# Patient Record
Sex: Male | Born: 1980 | Hispanic: Yes | Marital: Married | Smoking: Never smoker
Health system: Southern US, Community
[De-identification: ages and names within clinical notes are randomized; demographics above are authoritative.]

## PROBLEM LIST (undated history)

## (undated) DIAGNOSIS — Z789 Other specified health status: Secondary | ICD-10-CM

## (undated) HISTORY — PX: APPENDECTOMY: SHX54

---

## 2021-06-19 ENCOUNTER — Inpatient Hospital Stay (HOSPITAL_COMMUNITY)
Admission: EM | Admit: 2021-06-19 | Discharge: 2021-06-22 | DRG: 988 | Disposition: A | Discharge status: Home / Self Care | Payer: Self-pay | Attending: Internal Medicine | Admitting: Internal Medicine

## 2021-06-19 ENCOUNTER — Encounter (HOSPITAL_COMMUNITY): Payer: Self-pay | Admitting: Emergency Medicine

## 2021-06-19 ENCOUNTER — Other Ambulatory Visit: Payer: Self-pay

## 2021-06-19 ENCOUNTER — Emergency Department (HOSPITAL_COMMUNITY): Payer: Self-pay

## 2021-06-19 DIAGNOSIS — L0291 Cutaneous abscess, unspecified: Secondary | ICD-10-CM

## 2021-06-19 DIAGNOSIS — M009 Pyogenic arthritis, unspecified: Secondary | ICD-10-CM | POA: Diagnosis present

## 2021-06-19 DIAGNOSIS — B9562 Methicillin resistant Staphylococcus aureus infection as the cause of diseases classified elsewhere: Secondary | ICD-10-CM | POA: Diagnosis present

## 2021-06-19 DIAGNOSIS — L03116 Cellulitis of left lower limb: Principal | ICD-10-CM | POA: Diagnosis present

## 2021-06-19 DIAGNOSIS — S80812A Abrasion, left lower leg, initial encounter: Secondary | ICD-10-CM

## 2021-06-19 DIAGNOSIS — Z20822 Contact with and (suspected) exposure to covid-19: Secondary | ICD-10-CM | POA: Diagnosis present

## 2021-06-19 DIAGNOSIS — L089 Local infection of the skin and subcutaneous tissue, unspecified: Secondary | ICD-10-CM | POA: Insufficient documentation

## 2021-06-19 HISTORY — DX: Other specified health status: Z78.9

## 2021-06-19 LAB — CBC WITH DIFFERENTIAL/PLATELET
Abs Immature Granulocytes: 0.04 10*3/uL (ref 0.00–0.07)
Basophils Absolute: 0 10*3/uL (ref 0.0–0.1)
Basophils Relative: 0 %
Eosinophils Absolute: 0.1 10*3/uL (ref 0.0–0.5)
Eosinophils Relative: 1 %
HCT: 43.2 % (ref 39.0–52.0)
Hemoglobin: 14.4 g/dL (ref 13.0–17.0)
Immature Granulocytes: 0 %
Lymphocytes Relative: 10 %
Lymphs Abs: 1 10*3/uL (ref 0.7–4.0)
MCH: 31.5 pg (ref 26.0–34.0)
MCHC: 33.3 g/dL (ref 30.0–36.0)
MCV: 94.5 fL (ref 80.0–100.0)
Monocytes Absolute: 1.1 10*3/uL — ABNORMAL HIGH (ref 0.1–1.0)
Monocytes Relative: 11 %
Neutro Abs: 7.9 10*3/uL — ABNORMAL HIGH (ref 1.7–7.7)
Neutrophils Relative %: 78 %
Platelets: 268 10*3/uL (ref 150–400)
RBC: 4.57 MIL/uL (ref 4.22–5.81)
RDW: 12.6 % (ref 11.5–15.5)
WBC: 10.2 10*3/uL (ref 4.0–10.5)
nRBC: 0 % (ref 0.0–0.2)

## 2021-06-19 LAB — BASIC METABOLIC PANEL
Anion gap: 6 (ref 5–15)
BUN: 13 mg/dL (ref 6–20)
CO2: 28 mmol/L (ref 22–32)
Calcium: 8.7 mg/dL — ABNORMAL LOW (ref 8.9–10.3)
Chloride: 104 mmol/L (ref 98–111)
Creatinine, Ser: 1.01 mg/dL (ref 0.61–1.24)
GFR, Estimated: >60 mL/min (ref >60)
Glucose, Bld: 69 mg/dL — ABNORMAL LOW (ref 70–99)
Potassium: 4.3 mmol/L (ref 3.5–5.1)
Sodium: 138 mmol/L (ref 135–145)

## 2021-06-19 LAB — C-REACTIVE PROTEIN: CRP: 8.1 mg/dL — ABNORMAL HIGH (ref <1.0)

## 2021-06-19 LAB — SEDIMENTATION RATE: Sed Rate: 40 mm/hr — ABNORMAL HIGH (ref 0–16)

## 2021-06-19 LAB — RESP PANEL BY RT-PCR (FLU A&B, COVID) ARPGX2
Influenza A by PCR: NEGATIVE
Influenza B by PCR: NEGATIVE
SARS Coronavirus 2 by RT PCR: NEGATIVE

## 2021-06-19 MED ORDER — ACETAMINOPHEN 325 MG PO TABS
650.0000 mg | ORAL_TABLET | Freq: Four times a day (QID) | ORAL | Status: DC | PRN
  Administered 2021-06-19 – 2021-06-20 (×2): 650 mg via ORAL
  Filled 2021-06-19 (×2): qty 2

## 2021-06-19 MED ORDER — CEFAZOLIN SODIUM-DEXTROSE 1-4 GM/50ML-% IV SOLN
1.0000 g | Freq: Three times a day (TID) | INTRAVENOUS | Status: DC
  Administered 2021-06-19 – 2021-06-20 (×2): 1 g via INTRAVENOUS
  Filled 2021-06-19 (×2): qty 50

## 2021-06-19 MED ORDER — ACETAMINOPHEN 650 MG RE SUPP: 650.0000 mg | Freq: Four times a day (QID) | RECTAL | Status: DC | PRN

## 2021-06-19 MED ORDER — CEFAZOLIN SODIUM-DEXTROSE 1-4 GM/50ML-% IV SOLN
1.0000 g | Freq: Once | INTRAVENOUS | Status: AC
  Administered 2021-06-19: 14:00:00 1 g via INTRAVENOUS
  Filled 2021-06-19: qty 50

## 2021-06-19 MED ORDER — TRAMADOL HCL 50 MG PO TABS
50.0000 mg | ORAL_TABLET | Freq: Four times a day (QID) | ORAL | Status: DC | PRN
  Administered 2021-06-20 – 2021-06-22 (×5): 50 mg via ORAL
  Filled 2021-06-19 (×5): qty 1

## 2021-06-19 NOTE — H&P (Signed)
History and Physical    Gregory Leon VQQ:[id number] DOB: October 04, 1980 DOA: 06/19/2021  PCP: System, Provider Not In  Patient coming from: Home  Chief Complaint: left knee pain  HPI: Gregory Leon is a 40 y.o. male with no significant medical history. Presenting w/ left knee pain. He reports that he had a pimple on the lateral aspect of his knee about 2 days ago. He popped it. Ever since then, he has had increased redness and edema. It worsened through this morning and made it very difficult to walk d/t pain. When his symptoms did not improve, he decided to come to the ED for help. He denies any other aggravating or alleviating factors.   ED Course: XR w/ normal joint space. Ortho was consulted. They rec'd IV abx; no concern for septic joint. EDP aspirated fluid from a collection superior to the left knee joint space. TRH was called for admission.   Review of Systems:  Denies CP, palpitations, dyspnea, abdominal pain, N/V/D, fever. Review of systems is otherwise negative for all not mentioned in HPI.   PMHx Past Medical History:  Diagnosis Date   Medical history non-contributory     PSHx Past Surgical History:  Procedure Laterality Date   APPENDECTOMY      SocHx  reports that he has never smoked. He has never used smokeless tobacco. He reports current alcohol use. He reports that he does not currently use drugs.  No Known Allergies  FamHx History reviewed. No pertinent family history.  Prior to Admission medications   Not on File    Physical Exam: Vitals:   06/19/21 1430 06/19/21 1445 06/19/21 1500 06/19/21 1545  BP: 121/73 121/75 120/76 115/74  Pulse: 92 90 90 85  Resp:  16 17 18   Temp:      SpO2: 98% 99% 99% 98%    General: 40 y.o. male resting in bed in NAD Eyes: PERRL, normal sclera ENMT: Nares patent w/o discharge, orophaynx clear, dentition normal, ears w/o discharge/lesions/ulcers Neck: Supple, trachea midline Cardiovascular: RRR, +S1, S2,  no m/g/r, equal pulses throughout Respiratory: CTABL, no w/r/r, normal WOB GI: BS+, NDNT, no masses noted, no organomegaly noted MSK: No c/c; left lateral knee erythema/edema Skin: No rashes, bruises, ulcerations noted Neuro: A&O x 3, no focal deficits Psyc: Appropriate interaction and affect, calm/cooperative  Labs on Admission: I have personally reviewed following labs and imaging studies  CBC: Recent Labs  Lab 06/19/21 1232  WBC 10.2  NEUTROABS 7.9*  HGB 14.4  HCT 43.2  MCV 94.5  PLT 268   Basic Metabolic Panel: Recent Labs  Lab 06/19/21 1232  NA 138  K 4.3  CL 104  CO2 28  GLUCOSE 69*  BUN 13  CREATININE 1.01  CALCIUM 8.7*   GFR: CrCl cannot be calculated (Unknown ideal weight.). Liver Function Tests: No results for input(s): AST, ALT, ALKPHOS, BILITOT, PROT, ALBUMIN in the last 168 hours. No results for input(s): LIPASE, AMYLASE in the last 168 hours. No results for input(s): AMMONIA in the last 168 hours. Coagulation Profile: No results for input(s): INR, PROTIME in the last 168 hours. Cardiac Enzymes: No results for input(s): CKTOTAL, CKMB, CKMBINDEX, TROPONINI in the last 168 hours. BNP (last 3 results) No results for input(s): PROBNP in the last 8760 hours. HbA1C: No results for input(s): HGBA1C in the last 72 hours. CBG: No results for input(s): GLUCAP in the last 168 hours. Lipid Profile: No results for input(s): CHOL, HDL, LDLCALC, TRIG, CHOLHDL, LDLDIRECT in the last 72  hours. Thyroid Function Tests: No results for input(s): TSH, T4TOTAL, FREET4, T3FREE, THYROIDAB in the last 72 hours. Anemia Panel: No results for input(s): VITAMINB12, FOLATE, FERRITIN, TIBC, IRON, RETICCTPCT in the last 72 hours. Urine analysis: No results found for: COLORURINE, APPEARANCEUR, LABSPEC, PHURINE, GLUCOSEU, HGBUR, BILIRUBINUR, KETONESUR, PROTEINUR, UROBILINOGEN, NITRITE, LEUKOCYTESUR  Radiological Exams on Admission: DG Knee Complete 4 Views Left  Result Date:  06/19/2021 CLINICAL DATA:  Pain and swelling. Knee is red and warm. Swelling for 2 days. EXAM: LEFT KNEE - COMPLETE 4+ VIEW COMPARISON:  None. FINDINGS: Soft tissue swelling is noted superficially. No effusion is present. Joint space is normal on non standing views. No focal osseous lesions are present. IMPRESSION: Superficial soft tissue swelling without underlying osseous abnormality. Electronically Signed   By: Marin Roberts M.D.   On: 06/19/2021 12:24    EKG: None obtained in ED  Assessment/Plan Left knee cellulitis     - place in obs, med-surg     - eval'd by ortho, no concern for septic knee     - EDP was able to drain an abscess superficial to the knee joint     - ancef started in ED, continue  Hypoglycemia     - mild, asymptomatic     - encourage diet; follow glucose q4h x 2; if improved, can stop  DVT prophylaxis: SCDs  Code Status: FULL  Family Communication: None at bedside  Consults called: EDP spoke with ortho   Status is: Observation  The patient remains OBS appropriate and will d/c before 2 midnights.  Teddy Spike DO [facility]  If 7PM-7AM, please contact night-coverage [email or web address]  06/19/2021, 3:57 PM

## 2021-06-19 NOTE — ED Triage Notes (Signed)
PT sent from PCP for further eval of septic arthritis. L knee red and warm with swelling x2 days.

## 2021-06-19 NOTE — Consult Note (Signed)
ORTHOPAEDIC CONSULTATION  REQUESTING PHYSICIAN: Margarita Grizzle, MD  Chief Complaint: Left lateral knee swelling  HPI: Gregory Leon is a 40 y.o. male who presents with left lateral knee swelling and redness after he had a pustule that popped.  This subsequently became red and swollen tracking proximally up the leg.  He denies any pain with weightbearing or range of motion of the knee.  Currently works [employer].  He has been febrile in the emergency room.  Orthopedics consulted to rule out septic knee joint  History reviewed. No pertinent past medical history. History reviewed. No pertinent surgical history. Social History   Socioeconomic History   Marital status: Married    Spouse name: Not on file   Number of children: Not on file   Years of education: Not on file   Highest education level: Not on file  Occupational History   Not on file  Tobacco Use   Smoking status: Not on file   Smokeless tobacco: Not on file  Substance and Sexual Activity   Alcohol use: Not on file   Drug use: Not on file   Sexual activity: Not on file  Other Topics Concern   Not on file  Social History Narrative   Not on file   Social Determinants of Health   Financial Resource Strain: Not on file  Food Insecurity: Not on file  Transportation Needs: Not on file  Physical Activity: Not on file  Stress: Not on file  Social Connections: Not on file   No family history on file. - negative except otherwise stated in the family history section No Known Allergies Prior to Admission medications   Not on File   DG Knee Complete 4 Views Left  Result Date: 06/19/2021 CLINICAL DATA:  Pain and swelling. Knee is red and warm. Swelling for 2 days. EXAM: LEFT KNEE - COMPLETE 4+ VIEW COMPARISON:  None. FINDINGS: Soft tissue swelling is noted superficially. No effusion is present. Joint space is normal on non standing views. No focal osseous lesions are present. IMPRESSION: Superficial soft  tissue swelling without underlying osseous abnormality. Electronically Signed   By: Marin Roberts M.D.   On: 06/19/2021 12:24     Positive ROS: All other systems have been reviewed and were otherwise negative with the exception of those mentioned in the HPI and as above.  Physical Exam: General: No acute distress Cardiovascular: No pedal edema Respiratory: No cyanosis, no use of accessory musculature GI: No organomegaly, abdomen is soft and non-tender Skin: No lesions in the area of chief complaint Neurologic: Sensation intact distally Psychiatric: Patient is at baseline mood and affect Lymphatic: No axillary or cervical lymphadenopathy  MUSCULOSKELETAL:  There is redness and erythema about the lateral aspect of the distal femur.  There is no gross fluctuance.  There is tracking to the proximal thigh.  He has full painless range of motion of the left knee from 0 to 135 degrees.  Independent Imaging Review: X ray 4 view left knee: Normal  Assessment: 40 year old male with left lateral thigh cellulitis.  In the emergency room ultrasound was obtained and was able to aspirate a small amount of fluid that can be sent for culture.  There is no concern for septic joint at this time.  He may be treated for soft tissue cellulitis at this time per the primary medical team.  Plan: Recommend antibiotics per medical service with narrowing based on the ultrasound aspirate.  Thank you for the consult and the opportunity to see  [name]  Huel Cote, MD [facility] 3:19 PM

## 2021-06-19 NOTE — ED Provider Notes (Signed)
[place] DEPT Provider Note   CSN: [id number] Arrival date & time: 06/19/21  1128     History Chief Complaint  Patient presents with   Knee Pain    Gregory Leon is a 40 y.o. male.  HPI 40 year old male previously healthy presents today complaining of left leg swelling, redness, and pain.  Symptoms began several days ago.  There is no known trauma.  He felt like there was a pimple in the area.  He has had increasing pain and redness.  He was seen at an outlying office and sent here due to concerns about a septic joint.  Patient has had ongoing fever, chills, no nausea, or vomiting.    History reviewed. No pertinent past medical history.  Patient Active Problem List   Diagnosis Date Noted   Infected abrasion of left leg     History reviewed. No pertinent surgical history.     No family history on file.     Home Medications Prior to Admission medications   Not on File    Allergies    Patient has no known allergies.  Review of Systems   Review of Systems  All other systems reviewed and are negative.  Physical Exam Updated Vital Signs BP 120/76   Pulse 90   Temp (!) 100.7 F (38.2 C)   Resp 17   SpO2 99%   Physical Exam Vitals and nursing note reviewed.  Constitutional:      Appearance: Normal appearance.  HENT:     Head: Normocephalic.     Right Ear: External ear normal.     Left Ear: External ear normal.     Nose: Nose normal.     Mouth/Throat:     Pharynx: Oropharynx is clear.  Eyes:     Pupils: Pupils are equal, round, and reactive to light.  Cardiovascular:     Rate and Rhythm: Normal rate and regular rhythm.     Pulses: Normal pulses.  Pulmonary:     Effort: Pulmonary effort is normal.     Breath sounds: Normal breath sounds.  Abdominal:     General: Abdomen is flat.     Palpations: Abdomen is soft.  Musculoskeletal:        General: Swelling present.     Cervical back: Normal range of motion.      Comments: Erythema lateral left leg spreading approximately 15 cm proximally with swelling and 10 cm distally for total of 25 cm No joint effusion noted with full active range of motion of the left knee.  Skin:    General: Skin is warm and dry.     Capillary Refill: Capillary refill takes less than 2 seconds.  Neurological:     General: No focal deficit present.  Psychiatric:        Mood and Affect: Mood normal.    ED Results / Procedures / Treatments   Labs (all labs ordered are listed, but only abnormal results are displayed) Labs Reviewed  CBC WITH DIFFERENTIAL/PLATELET - Abnormal; Notable for the following components:      Result Value   Neutro Abs 7.9 (*)    Monocytes Absolute 1.1 (*)    All other components within normal limits  BASIC METABOLIC PANEL - Abnormal; Notable for the following components:   Glucose, Bld 69 (*)    Calcium 8.7 (*)    All other components within normal limits  SEDIMENTATION RATE - Abnormal; Notable for the following components:   Sed Rate 40 (*)  All other components within normal limits  AEROBIC/ANAEROBIC CULTURE W GRAM STAIN (SURGICAL/DEEP WOUND)  RESP PANEL BY RT-PCR (FLU A&B, COVID) [id number]  C-REACTIVE PROTEIN    EKG None  Radiology DG Knee Complete 4 Views Left  Result Date: 06/19/2021 CLINICAL DATA:  Pain and swelling. Knee is red and warm. Swelling for 2 days. EXAM: LEFT KNEE - COMPLETE 4+ VIEW COMPARISON:  None. FINDINGS: Soft tissue swelling is noted superficially. No effusion is present. Joint space is normal on non standing views. No focal osseous lesions are present. IMPRESSION: Superficial soft tissue swelling without underlying osseous abnormality. Electronically Signed   By: Marin Roberts M.D.   On: 06/19/2021 12:24    Procedures .Marland KitchenIncision and Drainage  Date/Time: 06/19/2021 3:13 PM Performed by: Margarita Grizzle, MD Authorized by: Margarita Grizzle, MD   Consent:    Consent obtained:  Verbal   Consent given by:   Patient   Risks discussed:  Bleeding and incomplete drainage   Alternatives discussed:  No treatment Universal protocol:    Patient identity confirmed:  Verbally with patient Location:    Type:  Abscess Pre-procedure details:    Skin preparation:  Chlorhexidine Sedation:    Sedation type:  None Anesthesia:    Anesthesia method:  None Procedure type:    Complexity:  Complex Procedure details:    Ultrasound guidance: yes     Needle aspiration: yes     Needle size:  18 G   Incision types:  Single straight   Incision depth:  Submucosal   Drainage:  Purulent   Drainage amount:  Scant   Wound treatment:  Wound left open   Packing materials:  None Post-procedure details:    Procedure completion:  Tolerated   Medications Ordered in ED Medications  ceFAZolin (ANCEF) IVPB 1 g/50 mL premix (0 g Intravenous Stopped 06/19/21 1440)    ED Course  I have reviewed the triage vital signs and the nursing notes.  Pertinent labs & imaging results that were available during my care of the patient were reviewed by me and considered in my medical decision making (see chart for details).    MDM Rules/Calculators/A&P                         40 year old male with cellulitis, with some fluid collections noted on ultrasound.  The knee does not appear to be involved.  There is significant swelling and erythema with fever.  Patient was treated here with Ancef.  There was some pus on needle aspiration.  This was sent to lab for culture. Discussed with Dr. Steward Drone, on-call for orthopedics who has seen and evaluated the patient at bedside.  He agrees with plan for admission and agrees with current treatment plan of antibiotics. Discussed with Dr. Ronaldo Miyamoto who will see for medicine team  Final Clinical Impression(s) / ED Diagnoses Final diagnoses:  Cellulitis of left lower extremity  Abscess    Rx / DC Orders ED Discharge Orders     None        Margarita Grizzle, MD 06/19/21 1530

## 2021-06-19 NOTE — ED Provider Notes (Signed)
Emergency Medicine Provider Triage Evaluation Note  Gregory Leon , a 40 y.o. male  was evaluated in triage.  Pt complains of left knee pain of 2-day duration along with fever.  Patient referred to emergency room from PCP office for concern of septic arthritis.   Review of Systems  Positive: Knee pain, fever Negative:   Physical Exam  BP (!) 141/68   Pulse 84   Temp (!) 100.7 F (38.2 C)   Resp 20   SpO2 99%  Gen:   Awake, no distress   Resp:  Normal effort  MSK:   Moves extremities without difficulty  Other:  Pain with extension and flexion of left knee.  2+ DP pulse present on the left  Medical Decision Making  Medically screening exam initiated at 12:08 PM.  Appropriate orders placed.  Gregory Leon was informed that the remainder of the evaluation will be completed by another provider, this initial triage assessment does not replace that evaluation, and the importance of remaining in the ED until their evaluation is complete.     Marita Kansas, PA-C 06/19/21 1210    Margarita Grizzle, MD 06/27/21 1224

## 2021-06-20 LAB — CBC
HCT: 39.7 % (ref 39.0–52.0)
Hemoglobin: 13.3 g/dL (ref 13.0–17.0)
MCH: 31.4 pg (ref 26.0–34.0)
MCHC: 33.5 g/dL (ref 30.0–36.0)
MCV: 93.6 fL (ref 80.0–100.0)
Platelets: 268 10*3/uL (ref 150–400)
RBC: 4.24 MIL/uL (ref 4.22–5.81)
RDW: 12.4 % (ref 11.5–15.5)
WBC: 7.3 10*3/uL (ref 4.0–10.5)
nRBC: 0 % (ref 0.0–0.2)

## 2021-06-20 LAB — COMPREHENSIVE METABOLIC PANEL
ALT: 21 U/L (ref 0–44)
AST: 17 U/L (ref 15–41)
Albumin: 3.5 g/dL (ref 3.5–5.0)
Alkaline Phosphatase: 57 U/L (ref 38–126)
Anion gap: 5 (ref 5–15)
BUN: 14 mg/dL (ref 6–20)
CO2: 24 mmol/L (ref 22–32)
Calcium: 8.6 mg/dL — ABNORMAL LOW (ref 8.9–10.3)
Chloride: 106 mmol/L (ref 98–111)
Creatinine, Ser: 0.84 mg/dL (ref 0.61–1.24)
GFR, Estimated: >60 mL/min (ref >60)
Glucose, Bld: 116 mg/dL — ABNORMAL HIGH (ref 70–99)
Potassium: 4 mmol/L (ref 3.5–5.1)
Sodium: 135 mmol/L (ref 135–145)
Total Bilirubin: 0.7 mg/dL (ref 0.3–1.2)
Total Protein: 6.5 g/dL (ref 6.5–8.1)

## 2021-06-20 LAB — GLUCOSE, CAPILLARY: Glucose-Capillary: 96 mg/dL (ref 70–99)

## 2021-06-20 LAB — HIV ANTIBODY (ROUTINE TESTING W REFLEX): HIV Screen 4th Generation wRfx: NONREACTIVE

## 2021-06-20 MED ORDER — VANCOMYCIN HCL 1250 MG/250ML IV SOLN
1250.0000 mg | Freq: Two times a day (BID) | INTRAVENOUS | Status: DC
  Administered 2021-06-20 – 2021-06-22 (×5): 1250 mg via INTRAVENOUS
  Filled 2021-06-20 (×5): qty 250

## 2021-06-20 NOTE — Progress Notes (Signed)
Pharmacy Antibiotic Note  Gregory Leon is a 40 y.o. male admitted on 06/19/2021 with cellulitis of the knee without concerns for septic knee per MD note. Pharmacy has been consulted for vancomycin dosing.  Plan: Vancomycin 125 mg IV Q 12 hrs. Goal AUC 400-550. Expected AUC: 447 SCr used: 0.84  Will f/u renal function, culture results, and clinical course Levels if/when indicated   Height: 5\' 9"  (175.3 cm) Weight: 75.8 kg (167 lb 1.6 oz) IBW/kg (Calculated) : 70.7  Temp (24hrs), Avg:99.3 F (37.4 C), Min:98.4 F (36.9 C), Max:101 F (38.3 C)  Recent Labs  Lab 06/19/21 1232 06/20/21 0425  WBC 10.2 7.3  CREATININE 1.01 0.84    Estimated Creatinine Clearance: 118.1 mL/min (by C-G formula based on SCr of 0.84 mg/dL).    No Known Allergies  Antimicrobials this admission: 11/28 cefazolin >> 11/29 11/29 vancomycin >>   Dose adjustments this admission:  Microbiology results: 11/28 Knee culture: [facility]  Thank you for allowing pharmacy to be a part of this patient's care.  [facility] D 06/20/2021 9:48 AM

## 2021-06-20 NOTE — Progress Notes (Addendum)
[facility] PROGRESS NOTE    Progress Note  Gregory Leon  [id number] DOB: January 05, 1981 DOA: 06/19/2021 PCP: System, Provider Not In     Brief Narrative:   Gregory Leon is an 40 y.o. male past medical history significant for left knee pain he relates he started having like a pimple on the lateral aspect of his knee about 2 days prior to admission ever since the redness is increased with edema and pain has worsened orthopedic was consulted who related there is no concern for septic knee    Assessment/Plan:   Cellulitis of left knee: No concerns for septic knee, the EDP was able to drain the superficial abscess of the knee joint. Started on IV Ancef, will go ahead and change antibiotics to IV Vanco. CRP of 8. Relates continues to be very painful and tight not able to bend the knee all the way. Appears to be unchanged from yesterday still warm and tender to touch erythema seems to be spreading.     DVT prophylaxis: lovenox Family Communication:none Status is: Observation  The patient will require care spanning > 2 midnights and should be moved to inpatient because: Left 5 knee cellulitis      Code Status:     Code Status Orders  (From admission, onward)           Start     Ordered   06/19/21 1805  Full code  Continuous        06/19/21 1804           Code Status History     This patient has a current code status but no historical code status.         IV Access:   Peripheral IV   Procedures and diagnostic studies:   DG Knee Complete 4 Views Left  Result Date: 06/19/2021 CLINICAL DATA:  Pain and swelling. Knee is red and warm. Swelling for 2 days. EXAM: LEFT KNEE - COMPLETE 4+ VIEW COMPARISON:  None. FINDINGS: Soft tissue swelling is noted superficially. No effusion is present. Joint space is normal on non standing views. No focal osseous lesions are present. IMPRESSION: Superficial soft tissue swelling without underlying  osseous abnormality. Electronically Signed   By: San Morelle M.D.   On: 06/19/2021 12:24     Medical Consultants:   None.   Subjective:    Gregory Leon still in pain medication is working not nauseated  Objective:    Vitals:   06/19/21 1846 06/19/21 2034 06/20/21 0159 06/20/21 0631  BP:  108/63 117/62 109/74  Pulse:  80 68 71  Resp:  18  17  Temp:  98.4 F (36.9 C) 99.3 F (37.4 C) 98.5 F (36.9 C)  TempSrc:   Oral   SpO2:  97% 98% 100%  Weight: 75.8 kg     Height: 5\' 9"  (1.753 m)      SpO2: 100 %   Intake/Output Summary (Last 24 hours) at 06/20/2021 0851 Last data filed at 06/20/2021 [id number] Gross per 24 hour  Intake 602.68 ml  Output 0 ml  Net 602.68 ml   Filed Weights   06/19/21 1846  Weight: 75.8 kg    Exam: General exam: In no acute distress. Respiratory system: Good air movement and clear to auscultation. Cardiovascular system: S1 & S2 heard, RRR. No JVD. Gastrointestinal system: Abdomen is nondistended, soft and nontender.  Extremities: No pedal edema. Skin: No rashes, lesions or ulcers Psychiatry: Judgement and insight appear normal. Mood & affect appropriate.  Data Reviewed:    Labs: Basic Metabolic Panel: Recent Labs  Lab 06/19/21 1232 06/20/21 0425  NA 138 135  K 4.3 4.0  CL 104 106  CO2 28 24  GLUCOSE 69* 116*  BUN 13 14  CREATININE 1.01 0.84  CALCIUM 8.7* 8.6*   GFR Estimated Creatinine Clearance: 118.1 mL/min (by C-G formula based on SCr of 0.84 mg/dL). Liver Function Tests: Recent Labs  Lab 06/20/21 0425  AST 17  ALT 21  ALKPHOS 57  BILITOT 0.7  PROT 6.5  ALBUMIN 3.5   No results for input(s): LIPASE, AMYLASE in the last 168 hours. No results for input(s): AMMONIA in the last 168 hours. Coagulation profile No results for input(s): INR, PROTIME in the last 168 hours. COVID-19 Labs  Recent Labs    06/19/21 1232  CRP 8.1*    Lab Results  Component Value Date   SARSCOV2NAA NEGATIVE  06/19/2021    CBC: Recent Labs  Lab 06/19/21 1232 06/20/21 0425  WBC 10.2 7.3  NEUTROABS 7.9*  --   HGB 14.4 13.3  HCT 43.2 39.7  MCV 94.5 93.6  PLT 268 268   Cardiac Enzymes: No results for input(s): CKTOTAL, CKMB, CKMBINDEX, TROPONINI in the last 168 hours. BNP (last 3 results) No results for input(s): PROBNP in the last 8760 hours. CBG: Recent Labs  Lab 06/19/21 1648  GLUCAP 96   D-Dimer: No results for input(s): DDIMER in the last 72 hours. Hgb A1c: No results for input(s): HGBA1C in the last 72 hours. Lipid Profile: No results for input(s): CHOL, HDL, LDLCALC, TRIG, CHOLHDL, LDLDIRECT in the last 72 hours. Thyroid function studies: No results for input(s): TSH, T4TOTAL, T3FREE, THYROIDAB in the last 72 hours.  Invalid input(s): FREET3 Anemia work up: No results for input(s): VITAMINB12, FOLATE, FERRITIN, TIBC, IRON, RETICCTPCT in the last 72 hours. Sepsis Labs: Recent Labs  Lab 06/19/21 1232 06/20/21 0425  WBC 10.2 7.3   Microbiology Recent Results (from the past 240 hour(s))  Aerobic Culture w Gram Stain (superficial specimen)     Status: None (Preliminary result)   Collection Time: 06/19/21  3:08 PM   Specimen: KNEE  Result Value Ref Range Status   Specimen Description KNEE  Final   Special Requests NONE  Final   Gram Stain   Final    NO WBC SEEN FEW GRAM POSITIVE COCCI Performed at [facility], [address] [place], [place] [address]    Culture PENDING  Incomplete   Report Status PENDING  Incomplete  Resp Panel by RT-PCR (Flu A&B, Covid) Nasopharyngeal Swab     Status: None   Collection Time: 06/19/21  3:29 PM   Specimen: Nasopharyngeal Swab; Nasopharyngeal(NP) swabs in vial transport medium  Result Value Ref Range Status   SARS Coronavirus 2 by RT PCR NEGATIVE NEGATIVE Final    Comment: (NOTE) SARS-CoV-2 target nucleic acids are NOT DETECTED.  The SARS-CoV-2 RNA is generally detectable in upper respiratory specimens during the  acute phase of infection. The lowest concentration of SARS-CoV-2 viral copies this assay can detect is 138 copies/mL. A negative result does not preclude SARS-Cov-2 infection and should not be used as the sole basis for treatment or other patient management decisions. A negative result may occur with  improper specimen collection/handling, submission of specimen other than nasopharyngeal swab, presence of viral mutation(s) within the areas targeted by this assay, and inadequate number of viral copies(<138 copies/mL). A negative result must be combined with clinical observations, patient history, and epidemiological  information. The expected result is Negative.  Fact Sheet for Patients:  EntrepreneurPulse.com.au  Fact Sheet for Healthcare Providers:  IncredibleEmployment.be  This test is no t yet approved or cleared by the [place] FDA and  has been authorized for detection and/or diagnosis of SARS-CoV-2 by FDA under an Emergency Use Authorization (EUA). This EUA will remain  in effect (meaning this test can be used) for the duration of the COVID-19 declaration under Section 564(b)(1) of the Act, 21 U.S.C.section 360bbb-3(b)(1), unless the authorization is terminated  or revoked sooner.       Influenza A by PCR NEGATIVE NEGATIVE Final   Influenza B by PCR NEGATIVE NEGATIVE Final    Comment: (NOTE) The Xpert Xpress SARS-CoV-2/FLU/RSV plus assay is intended as an aid in the diagnosis of influenza from Nasopharyngeal swab specimens and should not be used as a sole basis for treatment. Nasal washings and aspirates are unacceptable for Xpert Xpress SARS-CoV-2/FLU/RSV testing.  Fact Sheet for Patients: EntrepreneurPulse.com.au  Fact Sheet for Healthcare Providers: IncredibleEmployment.be  This test is not yet approved or cleared by the [place] FDA and has been authorized for detection and/or  diagnosis of SARS-CoV-2 by FDA under an Emergency Use Authorization (EUA). This EUA will remain in effect (meaning this test can be used) for the duration of the COVID-19 declaration under Section 564(b)(1) of the Act, 21 U.S.C. section 360bbb-3(b)(1), unless the authorization is terminated or revoked.  Performed at [facility], [place] [address], [place], [place] [address]      Medications:    Continuous Infusions:   ceFAZolin (ANCEF) IV 1 g (06/20/21 0622)      LOS: 0 days   Charlynne Cousins  [facility]  06/20/2021, 8:51 AM

## 2021-06-21 LAB — CREATININE, SERUM
Creatinine, Ser: 1.05 mg/dL (ref 0.61–1.24)
GFR, Estimated: >60 mL/min (ref >60)

## 2021-06-21 NOTE — Hospital Course (Signed)
11/28 presented with rapidly worsening swelling in the left knee lateral aspect.  Bedside I&D performed by EDP.  Orthopedic consulted.  Recommend conservative measures.  Admitted for IV antibiotics. 11/30 wound culture growing staph aureus.

## 2021-06-21 NOTE — Assessment & Plan Note (Signed)
Presents with rapidly worsening swelling on the lateral aspect of left knee. X-ray negative for any acute bony abnormality. Bedside I&D performed by EDP.  Orthopedic were consulted.  Recommend IV antibiotics without any intervention. Started on IV vancomycin. Wound culture growing staph aureus. Continue with vancomycin for now.  Likely can go home tomorrow pending sensitivity

## 2021-06-21 NOTE — Progress Notes (Signed)
[facility] Progress Note  Patient: Gregory Leon    WRU:[id number]  DOA: 06/19/2021    Date of Service: the patient was seen and examined on 06/21/2021  Brief hospital course: 11/28 presented with rapidly worsening swelling in the left knee lateral aspect.  Bedside I&D performed by EDP.  Orthopedic consulted.  Recommend conservative measures.  Admitted for IV antibiotics. 11/30 wound culture growing staph aureus.   Assessment and Plan: * Cellulitis of left knee Presents with rapidly worsening swelling on the lateral aspect of left knee. X-ray negative for any acute bony abnormality. Bedside I&D performed by EDP.  Orthopedic were consulted.  Recommend IV antibiotics without any intervention. Started on IV vancomycin. Wound culture growing staph aureus. Continue with vancomycin for now.  Likely can go home tomorrow pending sensitivity     Body mass index is 24.68 kg/m.        Subjective: Denies any acute complaint.  No nausea no vomiting.  No fever no chills.  No chest pain abdominal pain.  Pain in the left leg still present with limitation of the movement.  In bed without any weightbearing he does not have any acute complaint.  When he is trying to ambulate pain is significant.  Objective: Vital signs were reviewed and unremarkable.  Exam: General: Appear in mild distress, no Rash; Oral Mucosa Clear, moist. no Abnormal Neck Mass Or lumps, Conjunctiva normal  Cardiovascular: S1 and S2 Present, no Murmur, Respiratory: good respiratory effort, Bilateral Air entry present and CTA, no Crackles, no wheezes Abdomen: Bowel Sound present, Soft and no tenderness Extremities: no Pedal edema left knee redness and swollen. Neurology: alert and oriented to time, place, and person affect appropriate. no new focal deficit Gait not checked due to patient safety concerns       Data Reviewed: My review of labs, imaging, notes and other tests is significant for Wound culture  positive for staph aureus    Disposition:  Status is: Inpatient  Remains inpatient appropriate because: IV antibiotics are required.  For years until sensitivities are back.   Family Communication: None at bedside.  DVT Prophylaxis: SCDs Start: 06/19/21 1805   Time spent: 35 minutes.   Author: Lynden Oxford  06/21/2021 1:49 PM  To reach On-call, see care teams to locate the attending and reach out via [email or web address]. Between 7PM-7AM, please contact night-coverage If you still have difficulty reaching the attending provider, please page the [facility] (Director on Call) for [facility] on amion for assistance.

## 2021-06-22 LAB — AEROBIC CULTURE W GRAM STAIN (SUPERFICIAL SPECIMEN): Gram Stain: NONE SEEN

## 2021-06-22 MED ORDER — NAPROXEN 500 MG PO TABS: 500.0000 mg | ORAL_TABLET | Freq: Two times a day (BID) | ORAL | 0 refills | Status: AC

## 2021-06-22 MED ORDER — METHOCARBAMOL 500 MG PO TABS: 500.0000 mg | ORAL_TABLET | Freq: Three times a day (TID) | ORAL | 0 refills | Status: DC | PRN

## 2021-06-22 MED ORDER — DOXYCYCLINE HYCLATE 100 MG PO TABS: 100.0000 mg | ORAL_TABLET | Freq: Two times a day (BID) | ORAL | 0 refills | Status: AC

## 2021-06-22 MED ORDER — DOXYCYCLINE HYCLATE 100 MG PO TABS: 100.0000 mg | ORAL_TABLET | Freq: Two times a day (BID) | ORAL | Status: DC

## 2021-06-22 NOTE — Discharge Summary (Signed)
Physician Discharge Summary   Patient name: Gregory Leon  Admit date:     06/19/2021  Discharge date: 06/22/2021  Discharge Physician: Lynden Oxford   PCP: System, Provider Not In   Recommendations at discharge: keep wound clean.   Discharge Diagnoses Principal Problem:   Cellulitis of left knee  Hospital Course   11/28 presented with rapidly worsening swelling in the left knee lateral aspect.  Bedside I&D performed by EDP.  Orthopedic consulted.  Recommend conservative measures.  Admitted for IV antibiotics. 11/30 wound culture growing staph aureus.   * Cellulitis of left knee Presents with rapidly worsening swelling on the lateral aspect of left knee. X-ray negative for any acute bony abnormality. Bedside I&D performed by EDP.  Orthopedic were consulted.  Recommend IV antibiotics without any intervention. Started on IV vancomycin. Wound culture growing staph aureus. MRSA Was treated with vancomycin now on doxycyline.    Procedures performed: bedside I and D   Condition at discharge: good  Exam General: Appear in mild distress, no Rash; Oral Mucosa Clear, moist. no Abnormal Neck Mass Or lumps, Conjunctiva normal  Cardiovascular: S1 and S2 Present, no Murmur, Respiratory: good respiratory effort, Bilateral Air entry present and CTA, no Crackles, no wheezes Abdomen: Bowel Sound present, Soft and no tenderness Extremities: no Pedal edema Neurology: alert and oriented to time, place, and person affect appropriate. no new focal deficit Gait not checked due to patient safety concerns       Disposition: Home  Discharge time: greater than 30 minutes.   Allergies as of 06/22/2021   No Known Allergies      Medication List     TAKE these medications    doxycycline 100 MG tablet Commonly known as: VIBRA-TABS Take 1 tablet (100 mg total) by mouth 2 (two) times daily for 7 days.   methocarbamol 500 MG tablet Commonly known as: Robaxin Take 1 tablet (500 mg  total) by mouth every 8 (eight) hours as needed for muscle spasms.   naproxen 500 MG tablet Commonly known as: Naprosyn Take 1 tablet (500 mg total) by mouth 2 (two) times daily with a meal for 3 days.        DG Knee Complete 4 Views Left  Result Date: 06/19/2021 CLINICAL DATA:  Pain and swelling. Knee is red and warm. Swelling for 2 days. EXAM: LEFT KNEE - COMPLETE 4+ VIEW COMPARISON:  None. FINDINGS: Soft tissue swelling is noted superficially. No effusion is present. Joint space is normal on non standing views. No focal osseous lesions are present. IMPRESSION: Superficial soft tissue swelling without underlying osseous abnormality. Electronically Signed   By: Marin Roberts M.D.   On: 06/19/2021 12:24   Results for orders placed or performed during the hospital encounter of 06/19/21  Aerobic Culture w Gram Stain (superficial specimen)     Status: None   Collection Time: 06/19/21  3:08 PM   Specimen: KNEE  Result Value Ref Range Status   Specimen Description KNEE  Final   Special Requests NONE  Final   Gram Stain   Final    NO WBC SEEN FEW GRAM POSITIVE COCCI Performed at [facility], [address], [place], [place] [address]    Culture   Final    MODERATE METHICILLIN RESISTANT STAPHYLOCOCCUS AUREUS   Report Status 06/22/2021 FINAL  Final   Organism ID, Bacteria METHICILLIN RESISTANT STAPHYLOCOCCUS AUREUS  Final      Susceptibility   Methicillin resistant staphylococcus aureus - MIC*  CIPROFLOXACIN >=8 RESISTANT Resistant     ERYTHROMYCIN >=8 RESISTANT Resistant     GENTAMICIN <=0.5 SENSITIVE Sensitive     OXACILLIN >=4 RESISTANT Resistant     TETRACYCLINE <=1 SENSITIVE Sensitive     VANCOMYCIN <=0.5 SENSITIVE Sensitive     TRIMETH/SULFA >=320 RESISTANT Resistant     CLINDAMYCIN <=0.25 SENSITIVE Sensitive     RIFAMPIN <=0.5 SENSITIVE Sensitive     Inducible Clindamycin NEGATIVE Sensitive     * MODERATE METHICILLIN RESISTANT STAPHYLOCOCCUS AUREUS  Resp  Panel by RT-PCR (Flu A&B, Covid) Nasopharyngeal Swab     Status: None   Collection Time: 06/19/21  3:29 PM   Specimen: Nasopharyngeal Swab; Nasopharyngeal(NP) swabs in vial transport medium  Result Value Ref Range Status   SARS Coronavirus 2 by RT PCR NEGATIVE NEGATIVE Final    Comment: (NOTE) SARS-CoV-2 target nucleic acids are NOT DETECTED.  The SARS-CoV-2 RNA is generally detectable in upper respiratory specimens during the acute phase of infection. The lowest concentration of SARS-CoV-2 viral copies this assay can detect is 138 copies/mL. A negative result does not preclude SARS-Cov-2 infection and should not be used as the sole basis for treatment or other patient management decisions. A negative result may occur with  improper specimen collection/handling, submission of specimen other than nasopharyngeal swab, presence of viral mutation(s) within the areas targeted by this assay, and inadequate number of viral copies(<138 copies/mL). A negative result must be combined with clinical observations, patient history, and epidemiological information. The expected result is Negative.  Fact Sheet for Patients:  BloggerCourse.com  Fact Sheet for Healthcare Providers:  SeriousBroker.it  This test is no t yet approved or cleared by the [place] FDA and  has been authorized for detection and/or diagnosis of SARS-CoV-2 by FDA under an Emergency Use Authorization (EUA). This EUA will remain  in effect (meaning this test can be used) for the duration of the COVID-19 declaration under Section 564(b)(1) of the Act, 21 U.S.C.section 360bbb-3(b)(1), unless the authorization is terminated  or revoked sooner.       Influenza A by PCR NEGATIVE NEGATIVE Final   Influenza B by PCR NEGATIVE NEGATIVE Final    Comment: (NOTE) The Xpert Xpress SARS-CoV-2/FLU/RSV plus assay is intended as an aid in the diagnosis of influenza from Nasopharyngeal  swab specimens and should not be used as a sole basis for treatment. Nasal washings and aspirates are unacceptable for Xpert Xpress SARS-CoV-2/FLU/RSV testing.  Fact Sheet for Patients: BloggerCourse.com  Fact Sheet for Healthcare Providers: SeriousBroker.it  This test is not yet approved or cleared by the [place] FDA and has been authorized for detection and/or diagnosis of SARS-CoV-2 by FDA under an Emergency Use Authorization (EUA). This EUA will remain in effect (meaning this test can be used) for the duration of the COVID-19 declaration under Section 564(b)(1) of the Act, 21 U.S.C. section 360bbb-3(b)(1), unless the authorization is terminated or revoked.  Performed at [facility], [address] [place], [place] [address]     Signed:  Lynden Oxford MD.  [facility] 06/22/2021, 2:55 PM

## 2021-06-22 NOTE — Plan of Care (Signed)

## 2021-06-22 NOTE — Progress Notes (Incomplete)
Assessment unchanged. Pt verbalized understanding of dc instructions including medications and follow up care. Discharged via foot per pt request accompanied by NT.

## 2021-06-22 NOTE — Assessment & Plan Note (Signed)
Presents with rapidly worsening swelling on the lateral aspect of left knee. X-ray negative for any acute bony abnormality. Bedside I&D performed by EDP.  Orthopedic were consulted.  Recommend IV antibiotics without any intervention. Started on IV vancomycin. Wound culture growing staph aureus. MRSA Was treated with vancomycin now on doxycyline.

## 2022-11-29 IMAGING — CR DG KNEE COMPLETE 4+V*L*
4 series · 4 of 4 positions shown · non-contrast
Comparison: None.

CLINICAL DATA: Pain and swelling. Knee is red and warm. Swelling
for 2 days.

EXAM:
LEFT KNEE - COMPLETE 4+ VIEW

[t knee ap left]
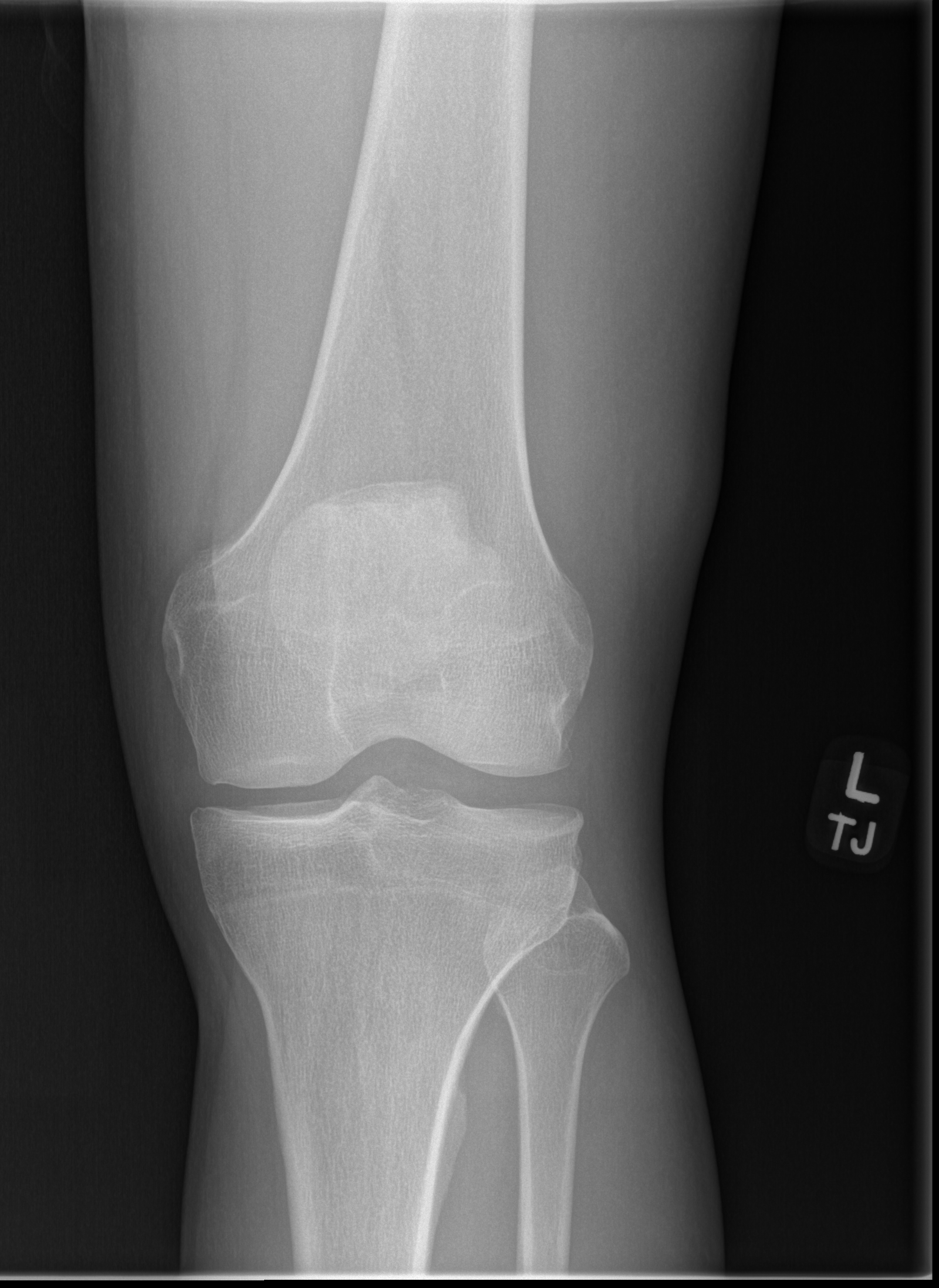

[t knee obl left (1 of 2)]
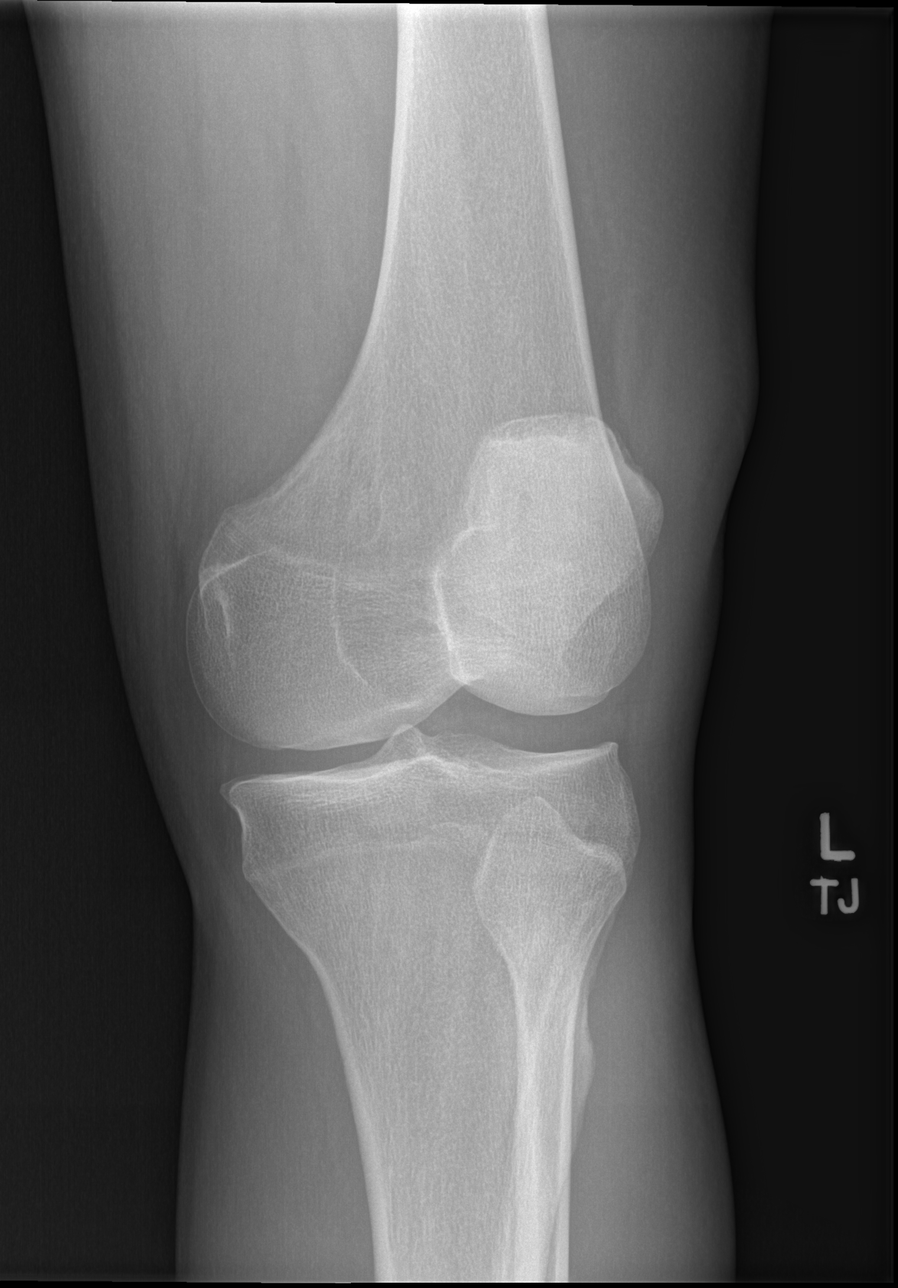

[t knee obl left (2 of 2)]
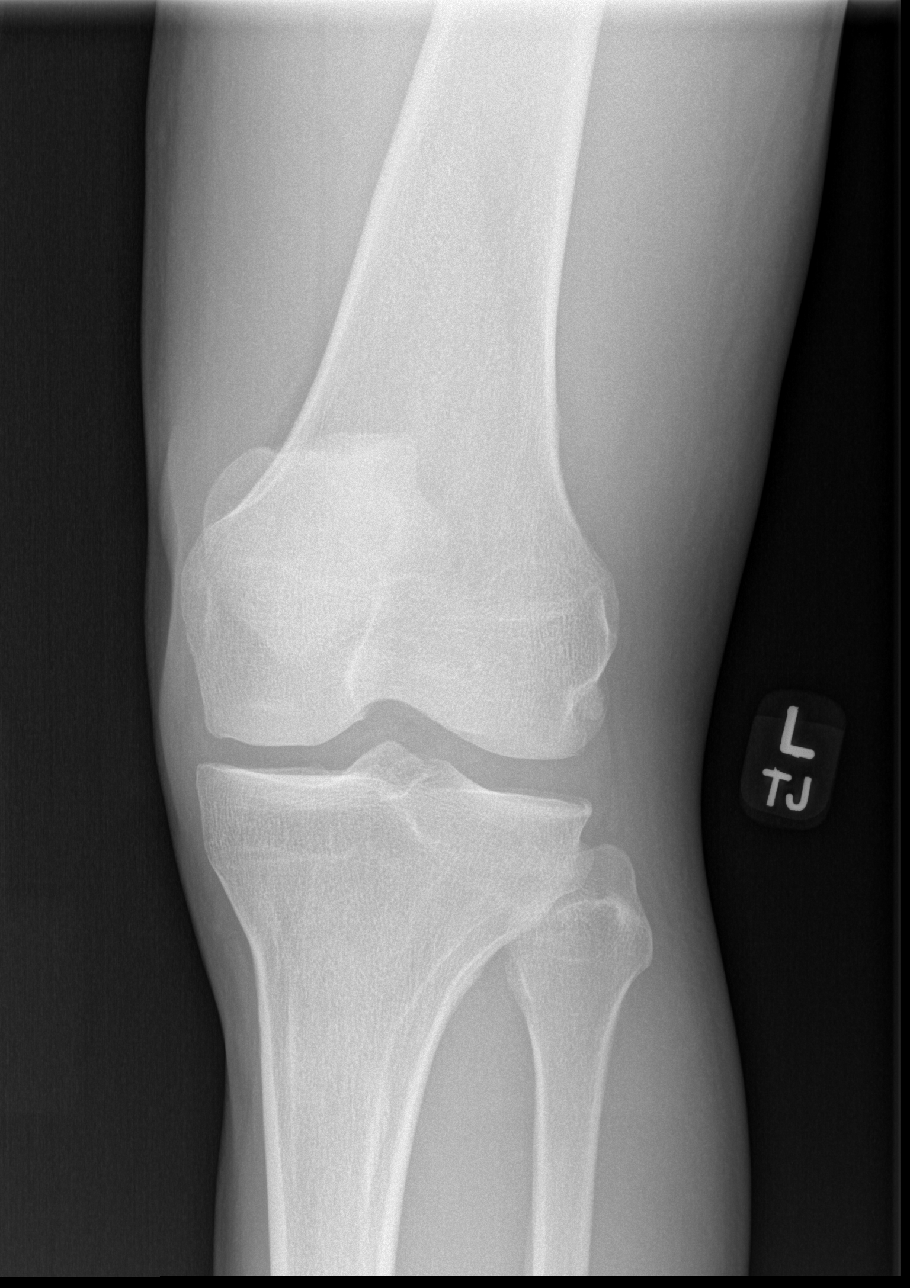

[x knee lat left]
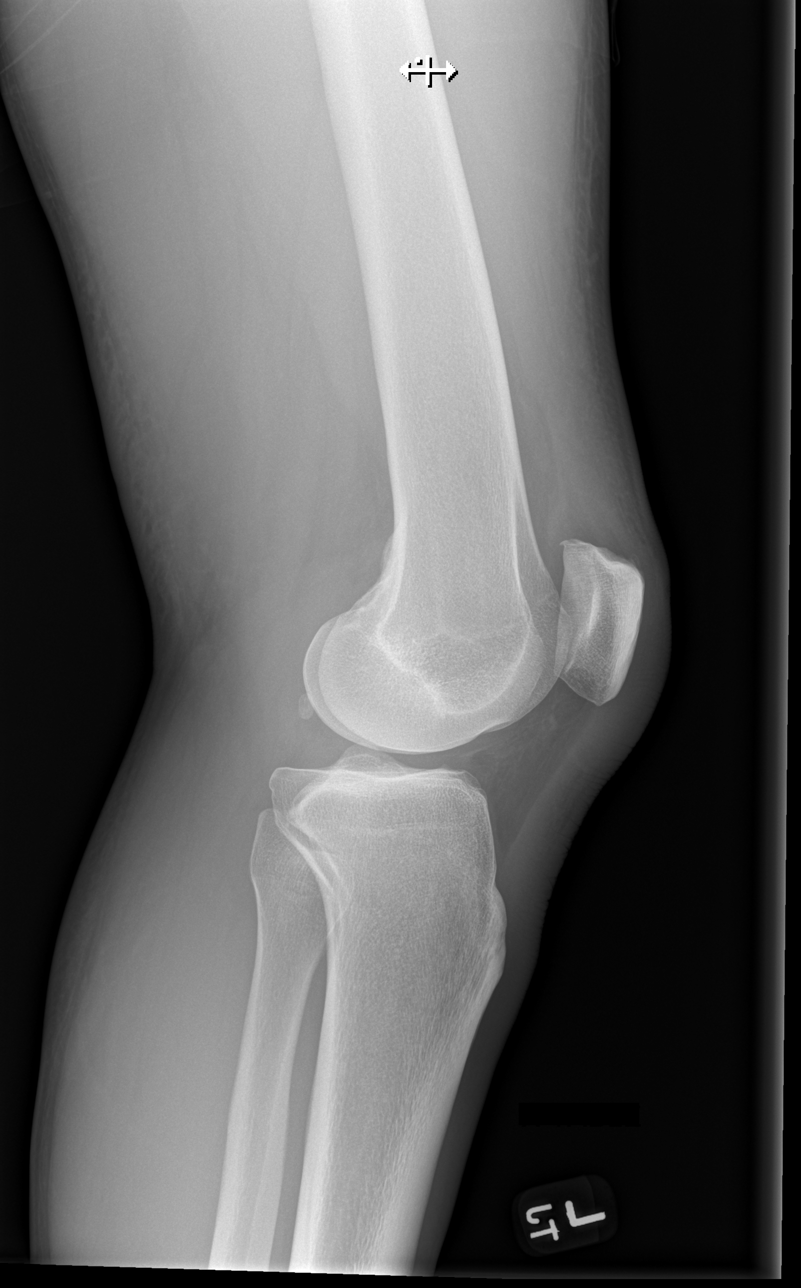

[4 of 4 positions shown; findings below may reference images not displayed]

FINDINGS: Soft tissue swelling is noted superficially. No effusion is present.
Joint space is normal on non standing views. No focal osseous
lesions are present.
IMPRESSION: Superficial soft tissue swelling without underlying osseous
abnormality.

## 2022-12-25 ENCOUNTER — Encounter (HOSPITAL_COMMUNITY): Payer: Self-pay

## 2022-12-25 ENCOUNTER — Encounter (HOSPITAL_COMMUNITY)
Admission: EM | Disposition: A | Discharge status: Home / Self Care | Payer: Self-pay | Source: Home / Self Care | Attending: Internal Medicine

## 2022-12-25 ENCOUNTER — Inpatient Hospital Stay (HOSPITAL_COMMUNITY)
Admission: EM | Admit: 2022-12-25 | Discharge: 2022-12-26 | DRG: 381 | Disposition: A | Discharge status: Home / Self Care | Payer: Self-pay | Attending: Internal Medicine | Admitting: Internal Medicine

## 2022-12-25 ENCOUNTER — Inpatient Hospital Stay (HOSPITAL_COMMUNITY): Payer: Self-pay | Admitting: Anesthesiology

## 2022-12-25 ENCOUNTER — Other Ambulatory Visit: Payer: Self-pay

## 2022-12-25 DIAGNOSIS — K2971 Gastritis, unspecified, with bleeding: Secondary | ICD-10-CM

## 2022-12-25 DIAGNOSIS — K3189 Other diseases of stomach and duodenum: Secondary | ICD-10-CM

## 2022-12-25 DIAGNOSIS — K449 Diaphragmatic hernia without obstruction or gangrene: Secondary | ICD-10-CM | POA: Diagnosis present

## 2022-12-25 DIAGNOSIS — K920 Hematemesis: Secondary | ICD-10-CM

## 2022-12-25 DIAGNOSIS — K2211 Ulcer of esophagus with bleeding: Principal | ICD-10-CM

## 2022-12-25 DIAGNOSIS — R5381 Other malaise: Secondary | ICD-10-CM | POA: Diagnosis present

## 2022-12-25 DIAGNOSIS — D649 Anemia, unspecified: Secondary | ICD-10-CM

## 2022-12-25 DIAGNOSIS — F1090 Alcohol use, unspecified, uncomplicated: Secondary | ICD-10-CM | POA: Diagnosis present

## 2022-12-25 DIAGNOSIS — K921 Melena: Secondary | ICD-10-CM

## 2022-12-25 DIAGNOSIS — R739 Hyperglycemia, unspecified: Secondary | ICD-10-CM | POA: Diagnosis present

## 2022-12-25 DIAGNOSIS — K922 Gastrointestinal hemorrhage, unspecified: Principal | ICD-10-CM

## 2022-12-25 DIAGNOSIS — Z8614 Personal history of Methicillin resistant Staphylococcus aureus infection: Secondary | ICD-10-CM

## 2022-12-25 DIAGNOSIS — D62 Acute posthemorrhagic anemia: Secondary | ICD-10-CM

## 2022-12-25 DIAGNOSIS — K2970 Gastritis, unspecified, without bleeding: Secondary | ICD-10-CM | POA: Diagnosis present

## 2022-12-25 DIAGNOSIS — Z1152 Encounter for screening for COVID-19: Secondary | ICD-10-CM

## 2022-12-25 HISTORY — PX: SCLEROTHERAPY: SHX6841

## 2022-12-25 HISTORY — PX: HOT HEMOSTASIS: SHX5433

## 2022-12-25 HISTORY — PX: ESOPHAGOGASTRODUODENOSCOPY: SHX5428

## 2022-12-25 HISTORY — PX: BIOPSY: SHX5522

## 2022-12-25 LAB — COMPREHENSIVE METABOLIC PANEL
ALT: 18 U/L (ref 0–44)
AST: 18 U/L (ref 15–41)
Albumin: 3.8 g/dL (ref 3.5–5.0)
Alkaline Phosphatase: 47 U/L (ref 38–126)
Anion gap: 8 (ref 5–15)
BUN: 35 mg/dL — ABNORMAL HIGH (ref 6–20)
CO2: 26 mmol/L (ref 22–32)
Calcium: 8.2 mg/dL — ABNORMAL LOW (ref 8.9–10.3)
Chloride: 104 mmol/L (ref 98–111)
Creatinine, Ser: 0.95 mg/dL (ref 0.61–1.24)
GFR, Estimated: >60 mL/min (ref >60)
Glucose, Bld: 154 mg/dL — ABNORMAL HIGH (ref 70–99)
Potassium: 4 mmol/L (ref 3.5–5.1)
Sodium: 138 mmol/L (ref 135–145)
Total Bilirubin: 1 mg/dL (ref 0.3–1.2)
Total Protein: 6.6 g/dL (ref 6.5–8.1)

## 2022-12-25 LAB — RESP PANEL BY RT-PCR (RSV, FLU A&B, COVID)  RVPGX2
Influenza A by PCR: NEGATIVE
Influenza B by PCR: NEGATIVE
Resp Syncytial Virus by PCR: NEGATIVE
SARS Coronavirus 2 by RT PCR: NEGATIVE

## 2022-12-25 LAB — HIV ANTIBODY (ROUTINE TESTING W REFLEX): HIV Screen 4th Generation wRfx: NONREACTIVE

## 2022-12-25 LAB — CBC
HCT: 41.9 % (ref 39.0–52.0)
Hemoglobin: 14 g/dL (ref 13.0–17.0)
MCH: 31 pg (ref 26.0–34.0)
MCHC: 33.4 g/dL (ref 30.0–36.0)
MCV: 92.9 fL (ref 80.0–100.0)
Platelets: 257 10*3/uL (ref 150–400)
RBC: 4.51 MIL/uL (ref 4.22–5.81)
RDW: 12.8 % (ref 11.5–15.5)
WBC: 4.6 10*3/uL (ref 4.0–10.5)
nRBC: 0 % (ref 0.0–0.2)

## 2022-12-25 LAB — HEMOGLOBIN AND HEMATOCRIT, BLOOD
HCT: 37 % — ABNORMAL LOW (ref 39.0–52.0)
HCT: 35.8 % — ABNORMAL LOW (ref 39.0–52.0)
HCT: 33.8 % — ABNORMAL LOW (ref 39.0–52.0)
Hemoglobin: 12.2 g/dL — ABNORMAL LOW (ref 13.0–17.0)
Hemoglobin: 11.8 g/dL — ABNORMAL LOW (ref 13.0–17.0)
Hemoglobin: 11.2 g/dL — ABNORMAL LOW (ref 13.0–17.0)

## 2022-12-25 LAB — MRSA NEXT GEN BY PCR, NASAL: MRSA by PCR Next Gen: NOT DETECTED

## 2022-12-25 LAB — ABO/RH: ABO/RH(D): O POS

## 2022-12-25 LAB — TYPE AND SCREEN
ABO/RH(D): O POS
Antibody Screen: NEGATIVE

## 2022-12-25 LAB — POC OCCULT BLOOD, ED: Fecal Occult Bld: POSITIVE — AB

## 2022-12-25 SURGERY — EGD (ESOPHAGOGASTRODUODENOSCOPY)
Anesthesia: Monitor Anesthesia Care

## 2022-12-25 MED ORDER — PANTOPRAZOLE INFUSION (NEW) - SIMPLE MED
8.0000 mg/h | INTRAVENOUS | Status: DC
  Administered 2022-12-25 (×3): 8 mg/h via INTRAVENOUS
  Filled 2022-12-25: qty 80
  Filled 2022-12-25: qty 100
  Filled 2022-12-25: qty 80
  Filled 2022-12-25: qty 100
  Filled 2022-12-25: qty 80

## 2022-12-25 MED ORDER — PROPOFOL 500 MG/50ML IV EMUL
INTRAVENOUS | Status: DC | PRN
  Administered 2022-12-25: 125 ug/kg/min via INTRAVENOUS

## 2022-12-25 MED ORDER — PROCHLORPERAZINE EDISYLATE 10 MG/2ML IJ SOLN: 5.0000 mg | Freq: Four times a day (QID) | INTRAMUSCULAR | Status: DC | PRN

## 2022-12-25 MED ORDER — LACTATED RINGERS IV SOLN: INTRAVENOUS | Status: DC | PRN

## 2022-12-25 MED ORDER — PANTOPRAZOLE 80MG IVPB - SIMPLE MED
80.0000 mg | Freq: Once | INTRAVENOUS | Status: AC
  Administered 2022-12-25: 80 mg via INTRAVENOUS
  Filled 2022-12-25: qty 80

## 2022-12-25 MED ORDER — SODIUM CHLORIDE 0.9 % IV SOLN: INTRAVENOUS | Status: AC

## 2022-12-25 MED ORDER — PROPOFOL 10 MG/ML IV BOLUS
INTRAVENOUS | Status: AC
  Filled 2022-12-25: qty 20

## 2022-12-25 MED ORDER — MAGNESIUM SULFATE 2 GM/50ML IV SOLN
2.0000 g | Freq: Once | INTRAVENOUS | Status: AC
Start: 2022-12-25 — End: 2022-12-25
  Administered 2022-12-25: 2 g via INTRAVENOUS
  Filled 2022-12-25: qty 50

## 2022-12-25 MED ORDER — EPINEPHRINE 1 MG/10ML IJ SOSY
PREFILLED_SYRINGE | INTRAMUSCULAR | Status: DC | PRN
  Administered 2022-12-25: .3 mg via INTRAVENOUS

## 2022-12-25 MED ORDER — PROPOFOL 10 MG/ML IV BOLUS
INTRAVENOUS | Status: DC | PRN
  Administered 2022-12-25: 60 mg via INTRAVENOUS

## 2022-12-25 MED ORDER — PROPOFOL 1000 MG/100ML IV EMUL
INTRAVENOUS | Status: AC
  Filled 2022-12-25: qty 100

## 2022-12-25 MED ORDER — PANTOPRAZOLE SODIUM 40 MG IV SOLR: 40.0000 mg | Freq: Two times a day (BID) | INTRAVENOUS | Status: DC

## 2022-12-25 MED ORDER — ONDANSETRON HCL 4 MG/2ML IJ SOLN
4.0000 mg | Freq: Once | INTRAMUSCULAR | Status: AC
  Administered 2022-12-25: 4 mg via INTRAVENOUS
  Filled 2022-12-25: qty 2

## 2022-12-25 MED ORDER — LIDOCAINE HCL (CARDIAC) PF 100 MG/5ML IV SOSY
PREFILLED_SYRINGE | INTRAVENOUS | Status: DC | PRN
  Administered 2022-12-25: 60 mg via INTRAVENOUS

## 2022-12-25 MED ORDER — SUCRALFATE 1 GM/10ML PO SUSP
1.0000 g | Freq: Three times a day (TID) | ORAL | Status: DC
  Administered 2022-12-25 – 2022-12-26 (×2): 1 g via ORAL
  Filled 2022-12-25 (×2): qty 10

## 2022-12-25 MED ORDER — SODIUM CHLORIDE 0.9 % IV BOLUS
500.0000 mL | Freq: Once | INTRAVENOUS | Status: AC
  Administered 2022-12-25: 500 mL via INTRAVENOUS

## 2022-12-25 MED ORDER — ORAL CARE MOUTH RINSE: 15.0000 mL | OROMUCOSAL | Status: DC | PRN

## 2022-12-25 MED ORDER — EPINEPHRINE 1 MG/10ML IJ SOSY
PREFILLED_SYRINGE | INTRAMUSCULAR | Status: AC
  Filled 2022-12-25: qty 10

## 2022-12-25 MED ORDER — SODIUM CHLORIDE 0.9 % IV BOLUS
1000.0000 mL | Freq: Once | INTRAVENOUS | Status: AC
  Administered 2022-12-25: 1000 mL via INTRAVENOUS

## 2022-12-25 MED ORDER — CHLORHEXIDINE GLUCONATE CLOTH 2 % EX PADS
6.0000 | MEDICATED_PAD | Freq: Every day | CUTANEOUS | Status: DC
  Administered 2022-12-25: 6 via TOPICAL

## 2022-12-25 MED ORDER — LACTATED RINGERS IV SOLN: INTRAVENOUS | Status: DC

## 2022-12-25 NOTE — ED Triage Notes (Addendum)
Patient reports stomach bug for a few days. Today he began having blood in his vomit and black stools. Patient also reports headache that began today. Has some abdominal pain, but states his headache is what's concerning him.

## 2022-12-25 NOTE — H&P (Signed)
History and Physical  Gregory Leon WJX:[id number] DOB: May 24, 1981 DOA: 12/25/2022  Referring physician: Dr. Nicanor Alcon, EDP  PCP: System, Provider Not In  Outpatient Specialists: None Patient coming from: Home  Chief Complaint: Sudden onset nausea and vomiting blood.  HPI: Gregory Leon is a 42 y.o. male with no significant past medical history, not on any prescribed medications, alcohol use 2 beers every 3 days, no use of NSAIDs who presents to [facility] ED from home due to sudden onset nausea and hematemesis with onset 3 hours prior to presentation, around 2 AM this morning.  Denies prior history of hematemesis or overt bleeding.  States he was doing fine over the weekend.  Yesterday around 6 PM he felt unwell with some generalized abdominal discomfort.  No reported subjective fevers or chills.  In the ED, BPs are soft, hemoglobin stable at 14.0.  The patient was promptly started on Protonix drip.  [facility], hospitalist service, was asked to admit.  Admitted to [facility] stepdown unit as inpatient status.   At the time of this visit, the patient is in the room accompanied by his 3 year old daughter.  He is alert and noted x 3.  The patient was made n.p.o. and GI was consulted.  ED Course: Temperature 98.  BP 98/62, pulse 68, respiratory 18, saturation 98% on room air.  Lab studies remarkable for WBC 4.6, hemoglobin 14.0, platelet count 257.  Serum glucose 154, BUN 35, creatinine 0.95.  Review of Systems: Review of systems as noted in the HPI. All other systems reviewed and are negative.   Past Medical History:  Diagnosis Date   Medical history non-contributory    Past Surgical History:  Procedure Laterality Date   APPENDECTOMY      Social History:  reports that he has never smoked. He has never used smokeless tobacco. He reports current alcohol use. He reports that he does not currently use drugs.   No Known Allergies  Family history: No family history of GI  bleed.  Prior to Admission medications   Medication Sig Start Date End Date Taking? Authorizing Provider  methocarbamol (ROBAXIN) 500 MG tablet Take 1 tablet (500 mg total) by mouth every 8 (eight) hours as needed for muscle spasms. Patient not taking: Reported on 12/25/2022 06/22/21   Rolly Salter, MD    Physical Exam: BP 107/72 (BP Location: Right Arm)   Pulse 64   Temp 98.2 F (36.8 C) (Oral)   Resp 17   SpO2 98%   General: 42 y.o. year-old male well developed well nourished in no acute distress.  Alert and oriented x3. Cardiovascular: Regular rate and rhythm with no rubs or gallops.  No thyromegaly or JVD noted.  No lower extremity edema. 2/4 pulses in all 4 extremities. Respiratory: Clear to auscultation with no wheezes or rales. Good inspiratory effort. Abdomen: Soft non distended with normal bowel sounds x4 quadrants. Muskuloskeletal: No cyanosis, clubbing or edema noted bilaterally Neuro: CN II-XII intact, strength, sensation, reflexes Skin: No ulcerative lesions noted or rashes Psychiatry: Judgement and insight appear normal. Mood is appropriate for condition and setting          Labs on Admission:  Basic Metabolic Panel: Recent Labs  Lab 12/25/22 0354  NA 138  K 4.0  CL 104  CO2 26  GLUCOSE 154*  BUN 35*  CREATININE 0.95  CALCIUM 8.2*   Liver Function Tests: Recent Labs  Lab 12/25/22 0354  AST 18  ALT 18  ALKPHOS 47  BILITOT 1.0  PROT 6.6  ALBUMIN 3.8   No results for input(s): "LIPASE", "AMYLASE" in the last 168 hours. No results for input(s): "AMMONIA" in the last 168 hours. CBC: Recent Labs  Lab 12/25/22 0354  WBC 4.6  HGB 14.0  HCT 41.9  MCV 92.9  PLT 257   Cardiac Enzymes: No results for input(s): "CKTOTAL", "CKMB", "CKMBINDEX", "TROPONINI" in the last 168 hours.  BNP (last 3 results) No results for input(s): "BNP" in the last 8760 hours.  ProBNP (last 3 results) No results for input(s): "PROBNP" in the last 8760 hours.  CBG: No  results for input(s): "GLUCAP" in the last 168 hours.  Radiological Exams on Admission: No results found.  EKG: I independently viewed the EKG done and my findings are as followed: None available at the time of this visit.  Assessment/Plan Present on Admission:  GI bleed  Principal Problem:   GI bleed  Hematemesis, unclear cause Denies use of NSAIDs Endorses drinks 2 beers every 3 days Started on Protonix drip, continue Serial H&H every 6 hours Maintain MAP greater than 65 Start IV fluid hydration LR at 125 cc/h x 1 day Keep n.p.o. until seen by GI Type and screen Transfuse hemoglobin less than 8. [facility], Dr. Dulce Sellar, consulted.  Hyperglycemia No prior history of diabetes Serum glucose 154 Open hemoglobin A1c Hold off insulin sliding scale to avoid hypoglycemia  Alcohol use 2 beers every 3 days No evidence of alcohol withdrawal at the time of this visit Recommend complete alcohol cessation.   DVT prophylaxis: SCDs  Code Status: Full code  Family Communication: 60 year old daughter at bedside.  Disposition Plan: Admitted to stepdown unit  Consults called: [facility]  Admission status: Inpatient status   Status is: Inpatient The patient requires at least 2 midnights for further evaluation and treatment of present condition.   Darlin Drop MD [facility] Pager [phone]  If 7PM-7AM, please contact night-coverage [email or web address] Password [facility]  12/25/2022, 4:58 AM

## 2022-12-25 NOTE — Transfer of Care (Signed)
Immediate Anesthesia Transfer of Care Note  Patient: Arshan Villareal  Procedure(s) Performed: ESOPHAGOGASTRODUODENOSCOPY (EGD) BIOPSY SCLEROTHERAPY HOT HEMOSTASIS (ARGON PLASMA COAGULATION/BICAP)  Patient Location: PACU  Anesthesia Type:MAC  Level of Consciousness: awake and pateint uncooperative  Airway & Oxygen Therapy: Patient Spontanous Breathing and Patient connected to face mask oxygen  Post-op Assessment: Report given to RN and Post -op Vital signs reviewed and stable  Post vital signs: Reviewed and stable  Last Vitals:  Vitals Value Taken Time  BP 108/60 12/25/22 1350  Temp    Pulse 81 12/25/22 1350  Resp 27 12/25/22 1350  SpO2 98 % 12/25/22 1350  Vitals shown include unvalidated device data.  Last Pain:  Vitals:   12/25/22 1224  TempSrc: Temporal  PainSc: 0-No pain         Complications: No notable events documented.

## 2022-12-25 NOTE — ED Notes (Signed)
ED TO INPATIENT HANDOFF REPORT  ED Nurse Name and Phone #: Sena Hoopingarner Woodroe Chen, Paramedic   S Name/Age/Gender [facility] 42 y.o. male Room/Bed: WA02/WA02  Code Status   Code Status: Full Code  Home/SNF/Other   Patient oriented to: self, place, time, and situation Is this baseline? Yes   Triage Complete: Triage complete  Chief Complaint GI bleed [K92.2] Hematemesis [K92.0]  Triage Note Patient reports stomach bug for a few days. Today he began having blood in his vomit and black stools. Patient also reports headache that began today. Has some abdominal pain, but states his headache is what's concerning him.    Allergies No Known Allergies  Level of Care/Admitting Diagnosis ED Disposition     ED Disposition  Admit   Condition  --   Comment  Hospital Area[facility] [[id number]]  Level of Care: Stepdown [14]  Admit to SDU based on following criteria: Severe physiological/psychological symptoms:  Any diagnosis requiring assessment & intervention at least every 4 hours on an ongoing basis to obtain desired patient outcomes including stability and rehabilitation  May admit patient to [facility] or [facility] if equivalent level of care is available:: Yes  Covid Evaluation: Asymptomatic - no recent exposure (last 10 days) testing not required  Diagnosis: Hematemesis [578.0.ICD-9-CM]  Admitting Physician: Darlin Drop [[id number]]  Attending Physician: Darlin Drop [[id number]]  Certification:: I certify this patient will need inpatient services for at least 2 midnights          B Medical/Surgery History Past Medical History:  Diagnosis Date   Medical history non-contributory    Past Surgical History:  Procedure Laterality Date   APPENDECTOMY       A IV Location/Drains/Wounds Patient Lines/Drains/Airways Status     Active Line/Drains/Airways     Name Placement date Placement time Site Days   Peripheral IV 12/25/22 20 G  Anterior;Proximal;Right Forearm 12/25/22  0357  Forearm  less than 1   Wound / Incision (Open or Dehisced) 06/20/21 Other (Comment) Knee Anterior;Left;Lateral 06/20/21  2000  Knee  553            Intake/Output Last 24 hours  Intake/Output Summary (Last 24 hours) at 12/25/2022 0746 Last data filed at 12/25/2022 0538 Gross per 24 hour  Intake 1650 ml  Output --  Net 1650 ml    Labs/Imaging Results for orders placed or performed during the hospital encounter of 12/25/22 (from the past 48 hour(s))  Comprehensive metabolic panel     Status: Abnormal   Collection Time: 12/25/22  3:54 AM  Result Value Ref Range   Sodium 138 135 - 145 mmol/L   Potassium 4.0 3.5 - 5.1 mmol/L   Chloride 104 98 - 111 mmol/L   CO2 26 22 - 32 mmol/L   Glucose, Bld 154 (H) 70 - 99 mg/dL    Comment: Glucose reference range applies only to samples taken after fasting for at least 8 hours.   BUN 35 (H) 6 - 20 mg/dL   Creatinine, Ser 1.91 0.61 - 1.24 mg/dL   Calcium 8.2 (L) 8.9 - 10.3 mg/dL   Total Protein 6.6 6.5 - 8.1 g/dL   Albumin 3.8 3.5 - 5.0 g/dL   AST 18 15 - 41 U/L   ALT 18 0 - 44 U/L   Alkaline Phosphatase 47 38 - 126 U/L   Total Bilirubin 1.0 0.3 - 1.2 mg/dL   GFR, Estimated >47 >82 mL/min    Comment: (NOTE) Calculated using the  CKD-EPI Creatinine Equation (2021)    Anion gap 8 5 - 15    Comment: Performed at [facility], [address] [place], [place] [address]  CBC     Status: None   Collection Time: 12/25/22  3:54 AM  Result Value Ref Range   WBC 4.6 4.0 - 10.5 K/uL   RBC 4.51 4.22 - 5.81 MIL/uL   Hemoglobin 14.0 13.0 - 17.0 g/dL   HCT 82.9 56.2 - 13.0 %   MCV 92.9 80.0 - 100.0 fL   MCH 31.0 26.0 - 34.0 pg   MCHC 33.4 30.0 - 36.0 g/dL   RDW 86.5 78.4 - 69.6 %   Platelets 257 150 - 400 K/uL   nRBC 0.0 0.0 - 0.2 %    Comment: Performed at [facility], [address] [place], [place] [address]  Type and screen [facility]      Status: None   Collection Time: 12/25/22  3:54 AM  Result Value Ref Range   ABO/RH(D) O POS    Antibody Screen NEG    Sample Expiration      12/28/2022,2359 Performed at [facility], [address], [place], [place] [address]   POC occult blood, ED     Status: Abnormal   Collection Time: 12/25/22  3:57 AM  Result Value Ref Range   Fecal Occult Bld POSITIVE (A) NEGATIVE  Resp panel by RT-PCR (RSV, Flu A&B, Covid) Anterior Nasal Swab     Status: None   Collection Time: 12/25/22  4:00 AM   Specimen: Anterior Nasal Swab  Result Value Ref Range   SARS Coronavirus 2 by RT PCR NEGATIVE NEGATIVE    Comment: (NOTE) SARS-CoV-2 target nucleic acids are NOT DETECTED.  The SARS-CoV-2 RNA is generally detectable in upper respiratory specimens during the acute phase of infection. The lowest concentration of SARS-CoV-2 viral copies this assay can detect is 138 copies/mL. A negative result does not preclude SARS-Cov-2 infection and should not be used as the sole basis for treatment or other patient management decisions. A negative result may occur with  improper specimen collection/handling, submission of specimen other than nasopharyngeal swab, presence of viral mutation(s) within the areas targeted by this assay, and inadequate number of viral copies(<138 copies/mL). A negative result must be combined with clinical observations, patient history, and epidemiological information. The expected result is Negative.  Fact Sheet for Patients:  BloggerCourse.com  Fact Sheet for Healthcare Providers:  SeriousBroker.it  This test is no t yet approved or cleared by the [place] FDA and  has been authorized for detection and/or diagnosis of SARS-CoV-2 by FDA under an Emergency Use Authorization (EUA). This EUA will remain  in effect (meaning this test can be used) for the duration of the COVID-19 declaration under Section  564(b)(1) of the Act, 21 U.S.C.section 360bbb-3(b)(1), unless the authorization is terminated  or revoked sooner.       Influenza A by PCR NEGATIVE NEGATIVE   Influenza B by PCR NEGATIVE NEGATIVE    Comment: (NOTE) The Xpert Xpress SARS-CoV-2/FLU/RSV plus assay is intended as an aid in the diagnosis of influenza from Nasopharyngeal swab specimens and should not be used as a sole basis for treatment. Nasal washings and aspirates are unacceptable for Xpert Xpress SARS-CoV-2/FLU/RSV testing.  Fact Sheet for Patients: BloggerCourse.com  Fact Sheet for Healthcare Providers: SeriousBroker.it  This test is not yet approved or cleared by the [place] FDA and has been authorized for detection  and/or diagnosis of SARS-CoV-2 by FDA under an Emergency Use Authorization (EUA). This EUA will remain in effect (meaning this test can be used) for the duration of the COVID-19 declaration under Section 564(b)(1) of the Act, 21 U.S.C. section 360bbb-3(b)(1), unless the authorization is terminated or revoked.     Resp Syncytial Virus by PCR NEGATIVE NEGATIVE    Comment: (NOTE) Fact Sheet for Patients: BloggerCourse.com  Fact Sheet for Healthcare Providers: SeriousBroker.it  This test is not yet approved or cleared by the [place] FDA and has been authorized for detection and/or diagnosis of SARS-CoV-2 by FDA under an Emergency Use Authorization (EUA). This EUA will remain in effect (meaning this test can be used) for the duration of the COVID-19 declaration under Section 564(b)(1) of the Act, 21 U.S.C. section 360bbb-3(b)(1), unless the authorization is terminated or revoked.  Performed at [facility], [address], [place], [place] [address]   Hemoglobin and hematocrit, blood     Status: Abnormal   Collection Time: 12/25/22  5:57 AM  Result Value Ref Range    Hemoglobin 12.2 (L) 13.0 - 17.0 g/dL   HCT 60.4 (L) 54.0 - 98.1 %    Comment: Performed at [facility], [address], [place], [place] [address]   No results found.  Pending Labs Unresulted Labs (From admission, onward)     Start     Ordered   12/26/22 0500  CBC  Tomorrow morning,   R        12/25/22 0539   12/26/22 0500  Comprehensive metabolic panel  Tomorrow morning,   R        12/25/22 0539   12/26/22 0500  Magnesium  Tomorrow morning,   R        12/25/22 0539   12/26/22 0500  Phosphorus  Tomorrow morning,   R        12/25/22 0539   12/25/22 0529  Hemoglobin and hematocrit, blood  Now then every 6 hours,   R (with TIMED occurrences)      12/25/22 0529   12/25/22 0458  HIV Antibody (routine testing w rflx)  (HIV Antibody (Routine testing w reflex) panel)  Once,   R        12/25/22 0458            Vitals/Pain Today's Vitals   12/25/22 0344 12/25/22 0440 12/25/22 0548 12/25/22 0700  BP:  102/69  101/70  Pulse:  70  64  Resp:  18    Temp:  98 F (36.7 C)    TempSrc:  Oral    SpO2:  99%  97%  PainSc: 8   2      Isolation Precautions No active isolations  Medications Medications  pantoprozole (PROTONIX) 80 mg /NS 100 mL infusion (8 mg/hr Intravenous New Bag/Given 12/25/22 0457)  pantoprazole (PROTONIX) injection 40 mg (has no administration in time range)  0.9 %  sodium chloride infusion ( Intravenous New Bag/Given 12/25/22 0538)  prochlorperazine (COMPAZINE) injection 5 mg (has no administration in time range)  ondansetron (ZOFRAN) injection 4 mg (4 mg Intravenous Given 12/25/22 0434)  sodium chloride 0.9 % bolus 500 mL (0 mLs Intravenous Stopped 12/25/22 0459)  pantoprazole (PROTONIX) 80 mg /NS 100 mL IVPB (0 mg Intravenous Stopped 12/25/22 0523)  sodium chloride 0.9 % bolus 1,000 mL (0 mLs Intravenous Stopped 12/25/22 0538)  magnesium sulfate IVPB 2 g 50 mL (0 g Intravenous Stopped 12/25/22 0453)    Mobility walks  Focused Assessments      R Recommendations: See Admitting Provider Note  Report given to:   Additional Notes:

## 2022-12-25 NOTE — Anesthesia Preprocedure Evaluation (Addendum)
Anesthesia Evaluation  Patient identified by MRN, date of birth, ID band Patient awake    Reviewed: Allergy & Precautions, NPO status , Patient's Chart, lab work & pertinent test results  History of Anesthesia Complications Negative for: history of anesthetic complications  Airway Mallampati: II  TM Distance: >3 FB Neck ROM: Full    Dental no notable dental hx.    Pulmonary neg pulmonary ROS   Pulmonary exam normal        Cardiovascular negative cardio ROS Normal cardiovascular exam     Neuro/Psych negative neurological ROS     GI/Hepatic Neg liver ROS,,,hematemesis   Endo/Other  negative endocrine ROS    Renal/GU negative Renal ROS     Musculoskeletal negative musculoskeletal ROS (+)    Abdominal   Peds  Hematology  (+) Blood dyscrasia (Hgb 11.8), anemia   Anesthesia Other Findings Day of surgery medications reviewed with patient.  Reproductive/Obstetrics                             Anesthesia Physical Anesthesia Plan  ASA: 3  Anesthesia Plan: MAC   Post-op Pain Management: Minimal or no pain anticipated   Induction:   PONV Risk Score and Plan: 1 and Treatment may vary due to age or medical condition and Propofol infusion  Airway Management Planned: Natural Airway and Nasal Cannula  Additional Equipment: None  Intra-op Plan:   Post-operative Plan:   Informed Consent: I have reviewed the patients History and Physical, chart, labs and discussed the procedure including the risks, benefits and alternatives for the proposed anesthesia with the patient or authorized representative who has indicated his/her understanding and acceptance.       Plan Discussed with: CRNA  Anesthesia Plan Comments:        Anesthesia Quick Evaluation

## 2022-12-25 NOTE — Hospital Course (Addendum)
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  Upon evaluation in the emergency department patient was found to have a normal hemoglobin initially which quickly down trended to 12.2.  Patient was also found to have a nelevated BUN.  Fecal occult blood test was positive.  Due to concerns for risk of continued bleeding the hospitalist group was called to assess the patient for admission to the hospital.  Patient was admitted to stepdown unit.  Patient was initiated on intravenous fluids as well as intravenous Protonix.  [facility] was consulted and arrangements were made for patient to undergo EGD the morning of 6/4.

## 2022-12-25 NOTE — Interval H&P Note (Signed)
History and Physical Interval Note:  12/25/2022 12:34 PM  [facility] Gregory Leon  has presented today for surgery, with the diagnosis of Hematemesis, melena.  The various methods of treatment have been discussed with the patient and family. After consideration of risks, benefits and other options for treatment, the patient has consented to  Procedure(s): ESOPHAGOGASTRODUODENOSCOPY (EGD) (N/A) as a surgical intervention.  The patient's history has been reviewed, patient examined, no change in status, stable for surgery.  I have reviewed the patient's chart and labs.  Questions were answered to the patient's satisfaction.     Lynann Bologna

## 2022-12-25 NOTE — ED Provider Notes (Signed)
[place] EMERGENCY DEPARTMENT AT [facility] Provider Note   CSN: [id number] Arrival date & time: 12/25/22  4098     History  Chief Complaint  Patient presents with   Emesis   Headache    Gregory Leon [name] is a 42 y.o. male.  The history is provided by the patient.  Emesis Severity:  Moderate Duration:  4 days Timing:  Intermittent Quality:  Stomach contents and bright red blood Progression:  Unchanged Chronicity:  New Recent urination:  Normal Context: not post-tussive   Relieved by:  Nothing Worsened by:  Nothing Ineffective treatments:  None tried Associated symptoms: headaches   Associated symptoms: no fever   Associated symptoms comment:  Took MOM and now having melena  Risk factors: no alcohol use   Patient with nausea and vomiting and had constipation and then took MOM then had melena      Home Medications Prior to Admission medications   Medication Sig Start Date End Date Taking? Authorizing Provider  methocarbamol (ROBAXIN) 500 MG tablet Take 1 tablet (500 mg total) by mouth every 8 (eight) hours as needed for muscle spasms. Patient not taking: Reported on 12/25/2022 06/22/21   Rolly Salter, MD      Allergies    Patient has no known allergies.    Review of Systems   Review of Systems  Constitutional:  Negative for fever.  HENT:  Negative for ear discharge.   Eyes:  Negative for redness.  Respiratory:  Negative for shortness of breath, wheezing and stridor.   Gastrointestinal:  Positive for blood in stool, nausea and vomiting.  Neurological:  Positive for headaches.  All other systems reviewed and are negative.   Physical Exam Updated Vital Signs BP 102/69   Pulse 70   Temp 98 F (36.7 C) (Oral)   Resp 18   SpO2 99%  Physical Exam Vitals and nursing note reviewed. Exam conducted with a chaperone present.  Constitutional:      General: He is not in acute distress.    Appearance: Normal appearance. He is well-developed. He  is not diaphoretic.  HENT:     Head: Normocephalic and atraumatic.     Nose: Nose normal.  Eyes:     Conjunctiva/sclera: Conjunctivae normal.     Pupils: Pupils are equal, round, and reactive to light.  Cardiovascular:     Rate and Rhythm: Normal rate and regular rhythm.     Pulses: Normal pulses.     Heart sounds: Normal heart sounds.  Pulmonary:     Effort: Pulmonary effort is normal.     Breath sounds: Normal breath sounds. No wheezing or rales.  Abdominal:     General: Bowel sounds are normal.     Palpations: Abdomen is soft.     Tenderness: There is no abdominal tenderness. There is no guarding or rebound.  Genitourinary:    Rectum: Guaiac result positive.     Comments: Melena  Musculoskeletal:        General: Normal range of motion.     Cervical back: Normal range of motion and neck supple.  Skin:    General: Skin is warm and dry.     Capillary Refill: Capillary refill takes less than 2 seconds.  Neurological:     General: No focal deficit present.     Mental Status: He is alert and oriented to person, place, and time.     Deep Tendon Reflexes: Reflexes normal.  Psychiatric:  Mood and Affect: Mood normal.        Behavior: Behavior normal.     ED Results / Procedures / Treatments   Labs (all labs ordered are listed, but only abnormal results are displayed) Results for orders placed or performed during the hospital encounter of 12/25/22  Resp panel by RT-PCR (RSV, Flu A&B, Covid) Anterior Nasal Swab   Specimen: Anterior Nasal Swab  Result Value Ref Range   SARS Coronavirus 2 by RT PCR NEGATIVE NEGATIVE   Influenza A by PCR NEGATIVE NEGATIVE   Influenza B by PCR NEGATIVE NEGATIVE   Resp Syncytial Virus by PCR NEGATIVE NEGATIVE  Comprehensive metabolic panel  Result Value Ref Range   Sodium 138 135 - 145 mmol/L   Potassium 4.0 3.5 - 5.1 mmol/L   Chloride 104 98 - 111 mmol/L   CO2 26 22 - 32 mmol/L   Glucose, Bld 154 (H) 70 - 99 mg/dL   BUN 35 (H) 6 - 20  mg/dL   Creatinine, Ser 6.04 0.61 - 1.24 mg/dL   Calcium 8.2 (L) 8.9 - 10.3 mg/dL   Total Protein 6.6 6.5 - 8.1 g/dL   Albumin 3.8 3.5 - 5.0 g/dL   AST 18 15 - 41 U/L   ALT 18 0 - 44 U/L   Alkaline Phosphatase 47 38 - 126 U/L   Total Bilirubin 1.0 0.3 - 1.2 mg/dL   GFR, Estimated >54 >09 mL/min   Anion gap 8 5 - 15  CBC  Result Value Ref Range   WBC 4.6 4.0 - 10.5 K/uL   RBC 4.51 4.22 - 5.81 MIL/uL   Hemoglobin 14.0 13.0 - 17.0 g/dL   HCT 81.1 91.4 - 78.2 %   MCV 92.9 80.0 - 100.0 fL   MCH 31.0 26.0 - 34.0 pg   MCHC 33.4 30.0 - 36.0 g/dL   RDW 95.6 21.3 - 08.6 %   Platelets 257 150 - 400 K/uL   nRBC 0.0 0.0 - 0.2 %  POC occult blood, ED  Result Value Ref Range   Fecal Occult Bld POSITIVE (A) NEGATIVE  Type and screen [facility]  Result Value Ref Range   ABO/RH(D) O POS    Antibody Screen NEG    Sample Expiration      12/28/2022,2359 Performed at [facility], [address], [place], [place] [address]    No results found.   Radiology No results found.  Procedures Procedures    Medications Ordered in ED Medications  pantoprozole (PROTONIX) 80 mg /NS 100 mL infusion (8 mg/hr Intravenous New Bag/Given 12/25/22 0457)  pantoprazole (PROTONIX) injection 40 mg (has no administration in time range)  0.9 %  sodium chloride infusion (has no administration in time range)  ondansetron (ZOFRAN) injection 4 mg (4 mg Intravenous Given 12/25/22 0434)  sodium chloride 0.9 % bolus 500 mL (0 mLs Intravenous Stopped 12/25/22 0459)  pantoprazole (PROTONIX) 80 mg /NS 100 mL IVPB (0 mg Intravenous Stopped 12/25/22 0523)  sodium chloride 0.9 % bolus 1,000 mL (1,000 mLs Intravenous New Bag/Given 12/25/22 0436)  magnesium sulfate IVPB 2 g 50 mL (0 g Intravenous Stopped 12/25/22 0453)    ED Course/ Medical Decision Making/ A&P                             Medical Decision Making Patient with nausea and vomiting and then melena   Amount and/or Complexity  of Data Reviewed Independent Historian:  Details: See above Son  External Data Reviewed: notes.    Details: Previous notes reviewed  Labs: ordered.    Details: Patient with negative covid and flu.  Normal white count 4.6, white count 14, normal platelet. Normal sodium 138, normal potassium 4, normal creatinine .95 hemoccult positive   Risk Prescription drug management. Decision regarding hospitalization.   Final Clinical Impression(s) / ED Diagnoses Final diagnoses:  Gastrointestinal hemorrhage, unspecified gastrointestinal hemorrhage type  Melena   The patient appears reasonably stabilized for admission considering the current resources, flow, and capabilities available in the ED at this time, and I doubt any other [facility] requiring further screening and/or treatment in the ED prior to admission.  Rx / DC Orders ED Discharge Orders     None         Sophonie Goforth, MD 12/25/22 4098

## 2022-12-25 NOTE — H&P (View-Only) (Signed)
[facility] Consult  Referring Provider: No ref. provider found Primary Care Physician:  System, Provider Not In Primary Gastroenterologist: Unassigned  Reason for Consultation: Hematemesis, melena  SUBJECTIVE:   HPI: Gregory Leon is a 42 y.o. male with no significant medical history presented to emergency department with chief complaint of acute onset hematemesis and melena.  Examination completed with assistance of virtual interpreter services.  Symptoms began 12/25/2022 at 2 AM.  He has been experiencing some periumbilical abdominal discomfort.  No significant nausea.  No chest pain or shortness of breath, no palpitations or cough.  This is never happened before.  No prior EGD or colonoscopy.  No family history colon cancer.  He has been taking ibuprofen, 1 pill, every 3 days for the past 2 weeks.  Rare alcohol use.  Labs on presentation showed hemoglobin 13.3 (now 12.2), platelet 257.  Fecal occult blood testing positive.  He was started on IV PPI therapy and gastroenterology was consulted.  Past Medical History:  Diagnosis Date   Medical history non-contributory    Past Surgical History:  Procedure Laterality Date   APPENDECTOMY     Prior to Admission medications   Medication Sig Start Date End Date Taking? Authorizing Provider  methocarbamol (ROBAXIN) 500 MG tablet Take 1 tablet (500 mg total) by mouth every 8 (eight) hours as needed for muscle spasms. Patient not taking: Reported on 12/25/2022 06/22/21   Patel, Pranav M, MD   Current Facility-Administered Medications  Medication Dose Route Frequency Provider Last Rate Last Admin   0.9 %  sodium chloride infusion   Intravenous Continuous Hall, Carole N, DO 125 mL/hr at 12/25/22 0538 New Bag at 12/25/22 0538   Chlorhexidine Gluconate Cloth 2 % PADS 6 each  6 each Topical Daily Hall, Carole N, DO   6 each at 12/25/22 0851   Oral care mouth rinse  15 mL Mouth Rinse PRN Hall, Carole N, DO       [START ON 12/28/2022]  pantoprazole (PROTONIX) injection 40 mg  40 mg Intravenous Q12H Palumbo, April, MD       pantoprozole (PROTONIX) 80 mg /NS 100 mL infusion  8 mg/hr Intravenous Continuous Palumbo, April, MD 10 mL/hr at 12/25/22 0457 8 mg/hr at 12/25/22 0457   prochlorperazine (COMPAZINE) injection 5 mg  5 mg Intravenous Q6H PRN Hall, Carole N, DO       Allergies as of 12/25/2022   (No Known Allergies)   History reviewed. No pertinent family history. Social History   Socioeconomic History   Marital status: Married    Spouse name: Not on file   Number of children: Not on file   Years of education: Not on file   Highest education level: Not on file  Occupational History   Not on file  Tobacco Use   Smoking status: Never   Smokeless tobacco: Never  Vaping Use   Vaping Use: Never used  Substance and Sexual Activity   Alcohol use: Yes    Comment: "a very little"   Drug use: Not Currently   Sexual activity: Not on file  Other Topics Concern   Not on file  Social History Narrative   Not on file   Social Determinants of Health   Financial Resource Strain: Not on file  Food Insecurity: No Food Insecurity (12/25/2022)   Hunger Vital Sign    Worried About Running Out of Food in the Last Year: Never true    Ran Out of Food in the Last Year: Never true    Transportation Needs: No Transportation Needs (12/25/2022)   PRAPARE - Transportation    Lack of Transportation (Medical): No    Lack of Transportation (Non-Medical): No  Physical Activity: Not on file  Stress: Not on file  Social Connections: Not on file  Intimate Partner Violence: Not At Risk (12/25/2022)   Humiliation, Afraid, Rape, and Kick questionnaire    Fear of Current or Ex-Partner: No    Emotionally Abused: No    Physically Abused: No    Sexually Abused: No   Review of Systems:  Review of Systems  Respiratory:  Negative for cough and shortness of breath.   Cardiovascular:  Negative for chest pain and palpitations.  Gastrointestinal:   Positive for abdominal pain, melena and vomiting. Negative for constipation, diarrhea and nausea.    OBJECTIVE:   Temp:  [98 F (36.7 C)-98.2 F (36.8 C)] 98 F (36.7 C) (06/04 0440) Pulse Rate:  [64-70] 64 (06/04 0700) Resp:  [17-18] 18 (06/04 0440) BP: (101-107)/(69-72) 101/70 (06/04 0700) SpO2:  [97 %-99 %] 97 % (06/04 0700) Weight:  [74.6 kg] 74.6 kg (06/04 0922) Last BM Date : 12/24/22 Physical Exam Constitutional:      General: He is not in acute distress.    Appearance: He is not ill-appearing, toxic-appearing or diaphoretic.  Cardiovascular:     Rate and Rhythm: Normal rate and regular rhythm.  Pulmonary:     Effort: No respiratory distress.     Breath sounds: Normal breath sounds.  Abdominal:     General: Bowel sounds are normal. There is no distension.     Palpations: Abdomen is soft.     Tenderness: There is no abdominal tenderness. There is no guarding.  Musculoskeletal:     Right lower leg: No edema.     Left lower leg: No edema.  Skin:    General: Skin is warm and dry.  Neurological:     Mental Status: He is alert.     Labs: Recent Labs    12/25/22 0354 12/25/22 0557  WBC 4.6  --   HGB 14.0 12.2*  HCT 41.9 37.0*  PLT 257  --    BMET Recent Labs    12/25/22 0354  NA 138  K 4.0  CL 104  CO2 26  GLUCOSE 154*  BUN 35*  CREATININE 0.95  CALCIUM 8.2*   LFT Recent Labs    12/25/22 0354  PROT 6.6  ALBUMIN 3.8  AST 18  ALT 18  ALKPHOS 47  BILITOT 1.0   PT/INR No results for input(s): "LABPROT", "INR" in the last 72 hours.  Diagnostic imaging: No results found.  IMPRESSION: Hematemesis Melena NSAID use  PLAN: -Recommend EGD to further evaluate hematemesis and melena -Discussed procedure in detail, alternatives, risks including bleeding/infection/perforation/anesthesia, patient verbalized understanding and elected to proceed -Maintain n.p.o. for procedure -Continue PPI therapy -Further recommendations to follow pending EGD  today   LOS: 0 days   Bela Bonaparte, DO [facility]    

## 2022-12-25 NOTE — Op Note (Signed)
[facility] Patient Name: Gregory Leon Procedure Date: 12/25/2022 MRN: [id number] Attending MD: Liliane Shi DO, DO, [id number] Date of Birth: 1980/11/07 CSN: [id number] Age: 42 Admit Type: Inpatient Procedure:                Upper GI endoscopy Indications:              Hematemesis, Melena Providers:                Liliane Shi DO, DO, Lorenza Evangelist, RN,                            Rozetta Nunnery, Technician Referring MD:              Medicines:                See the Anesthesia note for documentation of the                            administered medications Complications:            No immediate complications. Estimated Blood Loss:     Estimated blood loss was minimal. Procedure:                Pre-Anesthesia Assessment:                           - ASA Grade Assessment: III - A patient with severe                            systemic disease.                           - The risks and benefits of the procedure and the                            sedation options and risks were discussed with the                            patient. All questions were answered and informed                            consent was obtained.                           After obtaining informed consent, the endoscope was                            passed under direct vision. Throughout the                            procedure, the patient's blood pressure, pulse, and                            oxygen saturations were monitored continuously. The                            [id number]-[id number] ([id number]) Olympus endoscope was introduced  through the mouth, and advanced to the second part                            of duodenum. The upper GI endoscopy was                            accomplished without difficulty. The patient                            tolerated the procedure well. Scope In: Scope Out: Findings:      One cratered esophageal ulcer with stigmata of  recent bleeding was found       37 cm from the incisors. The lesion was 3 mm in largest dimension. Area       was successfully injected with 3 mL of a 0.1 mg/mL solution of       epinephrine for hemostasis. Coagulation for bleeding prevention using       bipolar probe was successful.      A 3 cm hiatal hernia was present. Possible linear cameron erosion.      Localized minimal inflammation characterized by congestion (edema) was       found in the gastric antrum. Biopsies were taken with a cold forceps for       Helicobacter pylori testing.      Patchy mildly erythematous mucosa without active bleeding and with no       stigmata of bleeding was found in the duodenal bulb.      The second portion of the duodenum was normal. Impression:               - Esophageal ulcer with stigmata of recent                            bleeding. Injected. Treated with bipolar cautery.                           - 3 cm hiatal hernia.                           - Gastritis. Biopsied.                           - Erythematous duodenopathy.                           - Normal second portion of the duodenum. Moderate Sedation:      See the other procedure note for documentation of moderate sedation with       intraservice time. Recommendation:           - Return patient to hospital ward for ongoing care.                           - Await pathology results.                           - Use Protonix (pantoprazole) 40 mg PO daily for 2  months.                           - Use sucralfate suspension 1 gram PO QID for 2                            weeks.                           - Avoidance of NSAIDs. Procedure Code(s):        --- Professional ---                           [phone], Esophagogastroduodenoscopy, flexible,                            transoral; with biopsy, single or multiple Diagnosis Code(s):        --- Professional ---                           K22.11, Ulcer of esophagus with  bleeding                           K44.9, Diaphragmatic hernia without obstruction or                            gangrene                           K29.70, Gastritis, unspecified, without bleeding                           K31.89, Other diseases of stomach and duodenum                           K92.0, Hematemesis                           K92.1, Melena (includes Hematochezia) CPT copyright 2022 American Medical Association. All rights reserved. The codes documented in this report are preliminary and upon coder review may  be revised to meet current compliance requirements. Dr Liliane Shi, DO Liliane Shi DO, DO 12/25/2022 1:52:21 PM Number of Addenda: 0

## 2022-12-25 NOTE — Anesthesia Postprocedure Evaluation (Signed)
Anesthesia Post Note  Patient: Gregory Leon  Procedure(s) Performed: ESOPHAGOGASTRODUODENOSCOPY (EGD) BIOPSY SCLEROTHERAPY HOT HEMOSTASIS (ARGON PLASMA COAGULATION/BICAP)     Patient location during evaluation: PACU Anesthesia Type: MAC Level of consciousness: awake and alert Pain management: pain level controlled Vital Signs Assessment: post-procedure vital signs reviewed and stable Respiratory status: spontaneous breathing, nonlabored ventilation and respiratory function stable Cardiovascular status: blood pressure returned to baseline Postop Assessment: no apparent nausea or vomiting Anesthetic complications: no   No notable events documented.      Shanda Howells

## 2022-12-25 NOTE — Consult Note (Signed)
[facility] Consult  Referring Provider: No ref. provider found Primary Care Physician:  System, Provider Not In Primary Gastroenterologist: Unassigned  Reason for Consultation: Hematemesis, melena  SUBJECTIVE:   HPI: Gregory Leon is a 42 y.o. male with no significant medical history presented to emergency department with chief complaint of acute onset hematemesis and melena.  Examination completed with assistance of virtual interpreter services.  Symptoms began 12/25/2022 at 2 AM.  He has been experiencing some periumbilical abdominal discomfort.  No significant nausea.  No chest pain or shortness of breath, no palpitations or cough.  This is never happened before.  No prior EGD or colonoscopy.  No family history colon cancer.  He has been taking ibuprofen, 1 pill, every 3 days for the past 2 weeks.  Rare alcohol use.  Labs on presentation showed hemoglobin 13.3 (now 12.2), platelet 257.  Fecal occult blood testing positive.  He was started on IV PPI therapy and gastroenterology was consulted.  Past Medical History:  Diagnosis Date   Medical history non-contributory    Past Surgical History:  Procedure Laterality Date   APPENDECTOMY     Prior to Admission medications   Medication Sig Start Date End Date Taking? Authorizing Provider  methocarbamol (ROBAXIN) 500 MG tablet Take 1 tablet (500 mg total) by mouth every 8 (eight) hours as needed for muscle spasms. Patient not taking: Reported on 12/25/2022 06/22/21   Rolly Salter, MD   Current Facility-Administered Medications  Medication Dose Route Frequency Provider Last Rate Last Admin   0.9 %  sodium chloride infusion   Intravenous Continuous Darlin Drop, DO 125 mL/hr at 12/25/22 0538 New Bag at 12/25/22 0538   Chlorhexidine Gluconate Cloth 2 % PADS 6 each  6 each Topical Daily Darlin Drop, DO   6 each at 12/25/22 1610   Oral care mouth rinse  15 mL Mouth Rinse PRN Darlin Drop, DO       [START ON 12/28/2022]  pantoprazole (PROTONIX) injection 40 mg  40 mg Intravenous Q12H Palumbo, April, MD       pantoprozole (PROTONIX) 80 mg /NS 100 mL infusion  8 mg/hr Intravenous Continuous Palumbo, April, MD 10 mL/hr at 12/25/22 0457 8 mg/hr at 12/25/22 0457   prochlorperazine (COMPAZINE) injection 5 mg  5 mg Intravenous Q6H PRN Dow Adolph N, DO       Allergies as of 12/25/2022   (No Known Allergies)   History reviewed. No pertinent family history. Social History   Socioeconomic History   Marital status: Married    Spouse name: Not on file   Number of children: Not on file   Years of education: Not on file   Highest education level: Not on file  Occupational History   Not on file  Tobacco Use   Smoking status: Never   Smokeless tobacco: Never  Vaping Use   Vaping Use: Never used  Substance and Sexual Activity   Alcohol use: Yes    Comment: "a very little"   Drug use: Not Currently   Sexual activity: Not on file  Other Topics Concern   Not on file  Social History Narrative   Not on file   Social Determinants of Health   Financial Resource Strain: Not on file  Food Insecurity: No Food Insecurity (12/25/2022)   Hunger Vital Sign    Worried About Running Out of Food in the Last Year: Never true    Ran Out of Food in the Last Year: Never true  Transportation Needs: No Transportation Needs (12/25/2022)   PRAPARE - Administrator, Civil Service (Medical): No    Lack of Transportation (Non-Medical): No  Physical Activity: Not on file  Stress: Not on file  Social Connections: Not on file  Intimate Partner Violence: Not At Risk (12/25/2022)   Humiliation, Afraid, Rape, and Kick questionnaire    Fear of Current or Ex-Partner: No    Emotionally Abused: No    Physically Abused: No    Sexually Abused: No   Review of Systems:  Review of Systems  Respiratory:  Negative for cough and shortness of breath.   Cardiovascular:  Negative for chest pain and palpitations.  Gastrointestinal:   Positive for abdominal pain, melena and vomiting. Negative for constipation, diarrhea and nausea.    OBJECTIVE:   Temp:  [98 F (36.7 C)-98.2 F (36.8 C)] 98 F (36.7 C) (06/04 0440) Pulse Rate:  [64-70] 64 (06/04 0700) Resp:  [17-18] 18 (06/04 0440) BP: (101-107)/(69-72) 101/70 (06/04 0700) SpO2:  [97 %-99 %] 97 % (06/04 0700) Weight:  [74.6 kg] 74.6 kg (06/04 0922) Last BM Date : 12/24/22 Physical Exam Constitutional:      General: He is not in acute distress.    Appearance: He is not ill-appearing, toxic-appearing or diaphoretic.  Cardiovascular:     Rate and Rhythm: Normal rate and regular rhythm.  Pulmonary:     Effort: No respiratory distress.     Breath sounds: Normal breath sounds.  Abdominal:     General: Bowel sounds are normal. There is no distension.     Palpations: Abdomen is soft.     Tenderness: There is no abdominal tenderness. There is no guarding.  Musculoskeletal:     Right lower leg: No edema.     Left lower leg: No edema.  Skin:    General: Skin is warm and dry.  Neurological:     Mental Status: He is alert.     Labs: Recent Labs    12/25/22 0354 12/25/22 0557  WBC 4.6  --   HGB 14.0 12.2*  HCT 41.9 37.0*  PLT 257  --    BMET Recent Labs    12/25/22 0354  NA 138  K 4.0  CL 104  CO2 26  GLUCOSE 154*  BUN 35*  CREATININE 0.95  CALCIUM 8.2*   LFT Recent Labs    12/25/22 0354  PROT 6.6  ALBUMIN 3.8  AST 18  ALT 18  ALKPHOS 47  BILITOT 1.0   PT/INR No results for input(s): "LABPROT", "INR" in the last 72 hours.  Diagnostic imaging: No results found.  IMPRESSION: Hematemesis Melena NSAID use  PLAN: -Recommend EGD to further evaluate hematemesis and melena -Discussed procedure in detail, alternatives, risks including bleeding/infection/perforation/anesthesia, patient verbalized understanding and elected to proceed -Maintain n.p.o. for procedure -Continue PPI therapy -Further recommendations to follow pending EGD  today   LOS: 0 days   Liliane Shi, [facility]

## 2022-12-26 DIAGNOSIS — K2211 Ulcer of esophagus with bleeding: Secondary | ICD-10-CM | POA: Diagnosis present

## 2022-12-26 DIAGNOSIS — D62 Acute posthemorrhagic anemia: Secondary | ICD-10-CM | POA: Diagnosis present

## 2022-12-26 LAB — CBC
HCT: 30.1 % — ABNORMAL LOW (ref 39.0–52.0)
HCT: 30.6 % — ABNORMAL LOW (ref 39.0–52.0)
Hemoglobin: 10 g/dL — ABNORMAL LOW (ref 13.0–17.0)
Hemoglobin: 10.1 g/dL — ABNORMAL LOW (ref 13.0–17.0)
MCH: 31.3 pg (ref 26.0–34.0)
MCH: 32 pg (ref 26.0–34.0)
MCHC: 32.7 g/dL (ref 30.0–36.0)
MCHC: 33.6 g/dL (ref 30.0–36.0)
MCV: 95.3 fL (ref 80.0–100.0)
MCV: 95.6 fL (ref 80.0–100.0)
Platelets: 199 10*3/uL (ref 150–400)
Platelets: 207 10*3/uL (ref 150–400)
RBC: 3.16 MIL/uL — ABNORMAL LOW (ref 4.22–5.81)
RBC: 3.2 MIL/uL — ABNORMAL LOW (ref 4.22–5.81)
RDW: 12.9 % (ref 11.5–15.5)
RDW: 12.9 % (ref 11.5–15.5)
WBC: 5.7 10*3/uL (ref 4.0–10.5)
WBC: 6.5 10*3/uL (ref 4.0–10.5)
nRBC: 0 % (ref 0.0–0.2)
nRBC: 0 % (ref 0.0–0.2)

## 2022-12-26 LAB — COMPREHENSIVE METABOLIC PANEL
ALT: 13 U/L (ref 0–44)
ALT: 13 U/L (ref 0–44)
AST: 13 U/L — ABNORMAL LOW (ref 15–41)
AST: 14 U/L — ABNORMAL LOW (ref 15–41)
Albumin: 2.7 g/dL — ABNORMAL LOW (ref 3.5–5.0)
Albumin: 2.8 g/dL — ABNORMAL LOW (ref 3.5–5.0)
Alkaline Phosphatase: 34 U/L — ABNORMAL LOW (ref 38–126)
Alkaline Phosphatase: 36 U/L — ABNORMAL LOW (ref 38–126)
Anion gap: 4 — ABNORMAL LOW (ref 5–15)
Anion gap: 6 (ref 5–15)
BUN: 18 mg/dL (ref 6–20)
BUN: 20 mg/dL (ref 6–20)
CO2: 22 mmol/L (ref 22–32)
CO2: 24 mmol/L (ref 22–32)
Calcium: 7.5 mg/dL — ABNORMAL LOW (ref 8.9–10.3)
Calcium: 7.6 mg/dL — ABNORMAL LOW (ref 8.9–10.3)
Chloride: 109 mmol/L (ref 98–111)
Chloride: 112 mmol/L — ABNORMAL HIGH (ref 98–111)
Creatinine, Ser: 0.87 mg/dL (ref 0.61–1.24)
Creatinine, Ser: 0.96 mg/dL (ref 0.61–1.24)
GFR, Estimated: >60 mL/min (ref >60)
GFR, Estimated: >60 mL/min (ref >60)
Glucose, Bld: 100 mg/dL — ABNORMAL HIGH (ref 70–99)
Glucose, Bld: 108 mg/dL — ABNORMAL HIGH (ref 70–99)
Potassium: 3.7 mmol/L (ref 3.5–5.1)
Potassium: 3.7 mmol/L (ref 3.5–5.1)
Sodium: 137 mmol/L (ref 135–145)
Sodium: 140 mmol/L (ref 135–145)
Total Bilirubin: 0.5 mg/dL (ref 0.3–1.2)
Total Bilirubin: 0.8 mg/dL (ref 0.3–1.2)
Total Protein: 4.7 g/dL — ABNORMAL LOW (ref 6.5–8.1)
Total Protein: 4.9 g/dL — ABNORMAL LOW (ref 6.5–8.1)

## 2022-12-26 LAB — IRON AND TIBC
Iron: 51 ug/dL (ref 45–182)
Saturation Ratios: 19 % (ref 17.9–39.5)
TIBC: 269 ug/dL (ref 250–450)
UIBC: 218 ug/dL

## 2022-12-26 LAB — MAGNESIUM: Magnesium: 2.1 mg/dL (ref 1.7–2.4)

## 2022-12-26 LAB — PROTIME-INR
INR: 1.1 (ref 0.8–1.2)
Prothrombin Time: 14.7 seconds (ref 11.4–15.2)

## 2022-12-26 LAB — APTT: aPTT: 27 seconds (ref 24–36)

## 2022-12-26 LAB — HEMOGLOBIN AND HEMATOCRIT, BLOOD
HCT: 31.4 % — ABNORMAL LOW (ref 39.0–52.0)
Hemoglobin: 10.3 g/dL — ABNORMAL LOW (ref 13.0–17.0)

## 2022-12-26 LAB — PHOSPHORUS: Phosphorus: 3.1 mg/dL (ref 2.5–4.6)

## 2022-12-26 MED ORDER — SUCRALFATE 1 G PO TABS: 1.0000 g | ORAL_TABLET | Freq: Four times a day (QID) | ORAL | 0 refills | Status: AC

## 2022-12-26 MED ORDER — PANTOPRAZOLE SODIUM 40 MG PO TBEC: 40.0000 mg | DELAYED_RELEASE_TABLET | Freq: Every day | ORAL | 0 refills | Status: AC

## 2022-12-26 MED ORDER — SODIUM CHLORIDE 0.9 % IV BOLUS
1000.0000 mL | Freq: Once | INTRAVENOUS | Status: AC
  Administered 2022-12-26: 1000 mL via INTRAVENOUS

## 2022-12-26 MED ORDER — PANTOPRAZOLE SODIUM 40 MG PO TBEC
40.0000 mg | DELAYED_RELEASE_TABLET | Freq: Every day | ORAL | Status: DC
  Administered 2022-12-26: 40 mg via ORAL
  Filled 2022-12-26: qty 1

## 2022-12-26 MED ORDER — ONDANSETRON HCL 4 MG/2ML IJ SOLN: 4.0000 mg | Freq: Four times a day (QID) | INTRAMUSCULAR | Status: DC | PRN

## 2022-12-26 NOTE — Plan of Care (Signed)

## 2022-12-26 NOTE — Discharge Summary (Signed)
Physician Discharge Summary  Gregory Leon WUJ:[id number] DOB: 1981/01/09 DOA: 12/25/2022  PCP: System, Provider Not In  Admit date: 12/25/2022 Discharge date: 12/26/2022  Admitted From: Home Disposition: Home  Recommendations for Outpatient Follow-up:  Follow up with PCP in 1-2 weeks Follow-up with gastroenterology, Dr. Lorenso Quarry in 1 month Continue Protonix 40 mg p.o. daily x 2 months Carafate 1 g p.o. 4 times daily x 14 days  Home Health: No Equipment/Devices: None  Discharge Condition: Stable CODE STATUS: Full code Diet recommendation: Regular diet  History of present illness:  Gregory Leon is a 42 year old Spanish-speaking male with past medical history significant for MRSA cellulitis of right knee 2022 who presented to [facility] ED on 6/4 with progressive abdominal pain, malaise and hematemesis.  Onset 4 days prior to admission with additional reports of melanotic stools.  Does endorse NSAID use with ibuprofen consistently over the past 2 weeks.  Denies significant alcohol use.  In the ED, temperature 98.2 F, HR 64, RR 17, BP 107/72, SpO2 98% on room air.  WBC 4.6, hemoglobin 14.0, platelets 257.  Sodium 138, potassium 4.0, chloride 104, CO2 26, glucose 154, BUN 35, creatinine 0.95.  AST 18, ALT 18, total bilirubin 1.0.  FOBT positive.  Patient was started on IV fluids, PPI drip.  GI was consulted.  TRH consulted for admission and further evaluation/management of acute upper GI bleed  Hospital course:  Upper GI bleed secondary to esophageal ulcer Patient presenting to ED with progressive abdominal pain, malaise, hematemesis in the setting of NSAID use outpatient.  Hemoglobin 14.0 on admission; which trended down to a low of 10.0.  Patient was started on Protonix drip and gastroenterology was consulted and patient underwent EGD on 12/25/2022 with findings of esophageal ulcer treated with epinephrine injection and bipolar cautery.  Patient's hemoglobin  remained stable.  Will discharge on Protonix 40 mg p.o. daily x 2 months and Carafate 1 g p.o. 4 times daily x 14 days.  Hemoglobin 10.3 at time of discharge.  Outpatient follow-up with gastroenterology 1 month.  Recommend repeat CBC at visit.    Discharge Diagnoses:  Active Problems:   Acute blood loss anemia (ABLA)   Esophageal ulcer with bleeding    Discharge Instructions  Discharge Instructions     Call MD for:  difficulty breathing, headache or visual disturbances   Complete by: As directed    Call MD for:  extreme fatigue   Complete by: As directed    Call MD for:  persistant dizziness or light-headedness   Complete by: As directed    Call MD for:  persistant nausea and vomiting   Complete by: As directed    Call MD for:  severe uncontrolled pain   Complete by: As directed    Call MD for:  temperature >100.4   Complete by: As directed    Diet - low sodium heart healthy   Complete by: As directed    Increase activity slowly   Complete by: As directed       Allergies as of 12/26/2022   No Known Allergies      Medication List     STOP taking these medications    methocarbamol 500 MG tablet Commonly known as: Robaxin       TAKE these medications    pantoprazole 40 MG tablet Commonly known as: PROTONIX Take 1 tablet (40 mg total) by mouth daily. Start taking on: December 27, 2022   sucralfate 1 g tablet Commonly known as: Carafate  Take 1 tablet (1 g total) by mouth 4 (four) times daily for 14 days. Dissolve in water        Follow-up Information     Lynann Bologna, DO. Schedule an appointment as soon as possible for a visit in 1 month(s).   Specialty: Gastroenterology Contact information: [address] [place] [address] [phone]                No Known Allergies  Consultations: [facility] gastroenterology, Dr. Lorenso Quarry   Procedures/Studies: No results found.   Subjective:  Patient seen examined at bedside, resting  calmly.  Lying in bed.  No specific complaints.  Assisted in interpretation with video interpreter, Bibiana ID [phone].  Tolerating diet.  Hemoglobin stable, 10.3 this morning.  Discussed with GI okay for discharge with outpatient follow-up in 1 month.  Denies headache, no dizziness, no chest pain, no palpitations, no shortness of breath, no abdominal pain, no fever/chills/night sweats, no nausea/vomit/diarrhea, no focal weakness, no fatigue, no paresthesias.  No acute events overnight per nursing staff.   Discharge Exam: Vitals:   12/26/22 0700 12/26/22 0800  BP: 114/68 (!) 99/59  Pulse: 61 (!) 59  Resp: 11 12  Temp:  98.6 F (37 C)  SpO2: 98% 98%   Vitals:   12/26/22 0500 12/26/22 0600 12/26/22 0700 12/26/22 0800  BP: (!) 101/50 (!) 103/59 114/68 (!) 99/59  Pulse: 68 64 61 (!) 59  Resp: 16 13 11 12   Temp:    98.6 F (37 C)  TempSrc:    Oral  SpO2: 98% 97% 98% 98%  Weight:      Height:        Physical Exam: GEN: NAD, alert and oriented x 3, wd/wn HEENT: NCAT, PERRL, EOMI, sclera clear, MMM PULM: CTAB w/o wheezes/crackles, normal respiratory effort, on room air CV: RRR w/o M/G/R GI: abd soft, NTND, NABS, no R/G/M MSK: no peripheral edema, muscle strength globally intact 5/5 bilateral upper/lower extremities NEURO: CN II-XII intact, no focal deficits, sensation to light touch intact PSYCH: normal mood/affect Integumentary: dry/intact, no rashes or wounds    The results of significant diagnostics from this hospitalization (including imaging, microbiology, ancillary and laboratory) are listed below for reference.     Microbiology: Recent Results (from the past 240 hour(s))  Resp panel by RT-PCR (RSV, Flu A&B, Covid) Anterior Nasal Swab     Status: None   Collection Time: 12/25/22  4:00 AM   Specimen: Anterior Nasal Swab  Result Value Ref Range Status   SARS Coronavirus 2 by RT PCR NEGATIVE NEGATIVE Final    Comment: (NOTE) SARS-CoV-2 target nucleic acids are NOT  DETECTED.  The SARS-CoV-2 RNA is generally detectable in upper respiratory specimens during the acute phase of infection. The lowest concentration of SARS-CoV-2 viral copies this assay can detect is 138 copies/mL. A negative result does not preclude SARS-Cov-2 infection and should not be used as the sole basis for treatment or other patient management decisions. A negative result may occur with  improper specimen collection/handling, submission of specimen other than nasopharyngeal swab, presence of viral mutation(s) within the areas targeted by this assay, and inadequate number of viral copies(<138 copies/mL). A negative result must be combined with clinical observations, patient history, and epidemiological information. The expected result is Negative.  Fact Sheet for Patients:  BloggerCourse.com  Fact Sheet for Healthcare Providers:  SeriousBroker.it  This test is no t yet approved or cleared by the [place] FDA and  has been  authorized for detection and/or diagnosis of SARS-CoV-2 by FDA under an Emergency Use Authorization (EUA). This EUA will remain  in effect (meaning this test can be used) for the duration of the COVID-19 declaration under Section 564(b)(1) of the Act, 21 U.S.C.section 360bbb-3(b)(1), unless the authorization is terminated  or revoked sooner.       Influenza A by PCR NEGATIVE NEGATIVE Final   Influenza B by PCR NEGATIVE NEGATIVE Final    Comment: (NOTE) The Xpert Xpress SARS-CoV-2/FLU/RSV plus assay is intended as an aid in the diagnosis of influenza from Nasopharyngeal swab specimens and should not be used as a sole basis for treatment. Nasal washings and aspirates are unacceptable for Xpert Xpress SARS-CoV-2/FLU/RSV testing.  Fact Sheet for Patients: BloggerCourse.com  Fact Sheet for Healthcare Providers: SeriousBroker.it  This test is not yet  approved or cleared by the [place] FDA and has been authorized for detection and/or diagnosis of SARS-CoV-2 by FDA under an Emergency Use Authorization (EUA). This EUA will remain in effect (meaning this test can be used) for the duration of the COVID-19 declaration under Section 564(b)(1) of the Act, 21 U.S.C. section 360bbb-3(b)(1), unless the authorization is terminated or revoked.     Resp Syncytial Virus by PCR NEGATIVE NEGATIVE Final    Comment: (NOTE) Fact Sheet for Patients: BloggerCourse.com  Fact Sheet for Healthcare Providers: SeriousBroker.it  This test is not yet approved or cleared by the [place] FDA and has been authorized for detection and/or diagnosis of SARS-CoV-2 by FDA under an Emergency Use Authorization (EUA). This EUA will remain in effect (meaning this test can be used) for the duration of the COVID-19 declaration under Section 564(b)(1) of the Act, 21 U.S.C. section 360bbb-3(b)(1), unless the authorization is terminated or revoked.  Performed at [facility], [address], [place], [place] [address]   MRSA Next Gen by PCR, Nasal     Status: None   Collection Time: 12/25/22  8:49 AM   Specimen: Nasal Mucosa; Nasal Swab  Result Value Ref Range Status   MRSA by PCR Next Gen NOT DETECTED NOT DETECTED Final    Comment: (NOTE) The GeneXpert MRSA Assay (FDA approved for NASAL specimens only), is one component of a comprehensive MRSA colonization surveillance program. It is not intended to diagnose MRSA infection nor to guide or monitor treatment for MRSA infections. Test performance is not FDA approved in patients less than 53 years old. Performed at [facility], [address] [place], [place] [address]      Labs: BNP (last 3 results) No results for input(s): "BNP" in the last 8760 hours. Basic Metabolic Panel: Recent Labs  Lab 12/25/22 0354  12/26/22 0005 12/26/22 0300  NA 138 137 140  K 4.0 3.7 3.7  CL 104 109 112*  CO2 26 24 22   GLUCOSE 154* 108* 100*  BUN 35* 20 18  CREATININE 0.95 0.96 0.87  CALCIUM 8.2* 7.5* 7.6*  MG  --  2.1  --   PHOS  --  3.1  --    Liver Function Tests: Recent Labs  Lab 12/25/22 0354 12/26/22 0005 12/26/22 0300  AST 18 14* 13*  ALT 18 13 13   ALKPHOS 47 34* 36*  BILITOT 1.0 0.8 0.5  PROT 6.6 4.9* 4.7*  ALBUMIN 3.8 2.8* 2.7*   No results for input(s): "LIPASE", "AMYLASE" in the last 168 hours. No results for input(s): "AMMONIA" in the last 168 hours. CBC: Recent Labs  Lab 12/25/22 0354 12/25/22 0557 12/25/22  1140 12/25/22 1658 12/26/22 0005 12/26/22 0300 12/26/22 0915  WBC 4.6  --   --   --  6.5 5.7  --   HGB 14.0   < > 11.8* 11.2* 10.1* 10.0* 10.3*  HCT 41.9   < > 35.8* 33.8* 30.1* 30.6* 31.4*  MCV 92.9  --   --   --  95.3 95.6  --   PLT 257  --   --   --  207 199  --    < > = values in this interval not displayed.   Cardiac Enzymes: No results for input(s): "CKTOTAL", "CKMB", "CKMBINDEX", "TROPONINI" in the last 168 hours. BNP: Invalid input(s): "POCBNP" CBG: No results for input(s): "GLUCAP" in the last 168 hours. D-Dimer No results for input(s): "DDIMER" in the last 72 hours. Hgb A1c No results for input(s): "HGBA1C" in the last 72 hours. Lipid Profile No results for input(s): "CHOL", "HDL", "LDLCALC", "TRIG", "CHOLHDL", "LDLDIRECT" in the last 72 hours. Thyroid function studies No results for input(s): "TSH", "T4TOTAL", "T3FREE", "THYROIDAB" in the last 72 hours.  Invalid input(s): "FREET3" Anemia work up Recent Labs    12/26/22 0300  TIBC 269  IRON 51   Urinalysis No results found for: "COLORURINE", "APPEARANCEUR", "LABSPEC", "PHURINE", "GLUCOSEU", "HGBUR", "BILIRUBINUR", "KETONESUR", "PROTEINUR", "UROBILINOGEN", "NITRITE", "LEUKOCYTESUR" Sepsis Labs Recent Labs  Lab 12/25/22 0354 12/26/22 0005 12/26/22 0300  WBC 4.6 6.5 5.7    Microbiology Recent Results (from the past 240 hour(s))  Resp panel by RT-PCR (RSV, Flu A&B, Covid) Anterior Nasal Swab     Status: None   Collection Time: 12/25/22  4:00 AM   Specimen: Anterior Nasal Swab  Result Value Ref Range Status   SARS Coronavirus 2 by RT PCR NEGATIVE NEGATIVE Final    Comment: (NOTE) SARS-CoV-2 target nucleic acids are NOT DETECTED.  The SARS-CoV-2 RNA is generally detectable in upper respiratory specimens during the acute phase of infection. The lowest concentration of SARS-CoV-2 viral copies this assay can detect is 138 copies/mL. A negative result does not preclude SARS-Cov-2 infection and should not be used as the sole basis for treatment or other patient management decisions. A negative result may occur with  improper specimen collection/handling, submission of specimen other than nasopharyngeal swab, presence of viral mutation(s) within the areas targeted by this assay, and inadequate number of viral copies(<138 copies/mL). A negative result must be combined with clinical observations, patient history, and epidemiological information. The expected result is Negative.  Fact Sheet for Patients:  BloggerCourse.com  Fact Sheet for Healthcare Providers:  SeriousBroker.it  This test is no t yet approved or cleared by the [place] FDA and  has been authorized for detection and/or diagnosis of SARS-CoV-2 by FDA under an Emergency Use Authorization (EUA). This EUA will remain  in effect (meaning this test can be used) for the duration of the COVID-19 declaration under Section 564(b)(1) of the Act, 21 U.S.C.section 360bbb-3(b)(1), unless the authorization is terminated  or revoked sooner.       Influenza A by PCR NEGATIVE NEGATIVE Final   Influenza B by PCR NEGATIVE NEGATIVE Final    Comment: (NOTE) The Xpert Xpress SARS-CoV-2/FLU/RSV plus assay is intended as an aid in the diagnosis of  influenza from Nasopharyngeal swab specimens and should not be used as a sole basis for treatment. Nasal washings and aspirates are unacceptable for Xpert Xpress SARS-CoV-2/FLU/RSV testing.  Fact Sheet for Patients: BloggerCourse.com  Fact Sheet for Healthcare Providers: SeriousBroker.it  This test is not yet approved or cleared by  the [employer] and has been authorized for detection and/or diagnosis of SARS-CoV-2 by FDA under an Emergency Use Authorization (EUA). This EUA will remain in effect (meaning this test can be used) for the duration of the COVID-19 declaration under Section 564(b)(1) of the Act, 21 U.S.C. section 360bbb-3(b)(1), unless the authorization is terminated or revoked.     Resp Syncytial Virus by PCR NEGATIVE NEGATIVE Final    Comment: (NOTE) Fact Sheet for Patients: BloggerCourse.com  Fact Sheet for Healthcare Providers: SeriousBroker.it  This test is not yet approved or cleared by the [place] FDA and has been authorized for detection and/or diagnosis of SARS-CoV-2 by FDA under an Emergency Use Authorization (EUA). This EUA will remain in effect (meaning this test can be used) for the duration of the COVID-19 declaration under Section 564(b)(1) of the Act, 21 U.S.C. section 360bbb-3(b)(1), unless the authorization is terminated or revoked.  Performed at [facility], [address], [place], [place] [address]   MRSA Next Gen by PCR, Nasal     Status: None   Collection Time: 12/25/22  8:49 AM   Specimen: Nasal Mucosa; Nasal Swab  Result Value Ref Range Status   MRSA by PCR Next Gen NOT DETECTED NOT DETECTED Final    Comment: (NOTE) The GeneXpert MRSA Assay (FDA approved for NASAL specimens only), is one component of a comprehensive MRSA colonization surveillance program. It is not intended to diagnose MRSA infection nor to  guide or monitor treatment for MRSA infections. Test performance is not FDA approved in patients less than 34 years old. Performed at [facility], [address], [place], [place] [address]      Time coordinating discharge: Over 30 minutes  SIGNED:   Alvira Philips [place], DO  [facility] 12/26/2022, 10:31 AM

## 2022-12-26 NOTE — Assessment & Plan Note (Signed)
·   Please see assessment and plan above °

## 2022-12-26 NOTE — Assessment & Plan Note (Signed)
Patient admitted for multiple episodes of hematemesis/melena Hemoglobin has down trended from 14.0 on arrival to 11.2 at this point although I expect it may downtrend slightly more for stabilizing considering the volume of blood loss. EGD has revealed a substantial esophageal ulcer that is likely the culprit for the bleed.  Intervention was taken to stop the bleeding including epinephrine injection and coagulation. Case discussed with Dr. Lorenso Quarry after the EGD, patient will require close monitoring with serial CBCs on continued intravenous Protonix and Carafate.  He recommends trying a soft diet.  Based on patient's clinical course patient may be discharged in the next 24 to 48 hours. Because of high risk of rebleed we will keep in the stepdown unit.

## 2022-12-26 NOTE — Progress Notes (Signed)
PROGRESS NOTE   Gregory Leon  ZOX:[id number] DOB: [birth year] DOA: 12/25/2022 PCP: System, Provider Not In    Brief Narrative:  42 year old Spanish-speaking male with history of MRSA cellulitis of the right knee in 2022 without joint involvement presenting to [facility] emergency department with a 4 days of abdominal discomfort and malaise and a 1 day history of several episodes of hematemesis alongside melena.  Upon evaluation in the emergency department patient was found to have a normal hemoglobin initially which quickly down trended to 12.2.  Patient was also found to have a nelevated BUN.  Fecal occult blood test was positive.  Due to concerns for risk of continued bleeding the hospitalist group was called to assess the patient for admission to the hospital.  Patient was admitted to stepdown unit.  Patient was initiated on intravenous fluids as well as intravenous Protonix.  [facility] was consulted and arrangements were made for patient to undergo EGD the morning of 6/4.     Assessment and Plan: Acute blood loss anemia (ABLA) Patient admitted for multiple episodes of hematemesis/melena Hemoglobin has down trended from 14.0 on arrival to 11.2 at this point although I expect it may downtrend slightly more for stabilizing considering the volume of blood loss. EGD has revealed a substantial esophageal ulcer that is likely the culprit for the bleed.  Intervention was taken to stop the bleeding including epinephrine injection and coagulation. Case discussed with Dr. Lorenso Quarry after the EGD, patient will require close monitoring with serial CBCs on continued intravenous Protonix and Carafate.  He recommends trying a soft diet.  Based on patient's clinical course patient may be discharged in the next 24 to 48 hours. Because of high risk of rebleed we will keep in the stepdown unit.  Esophageal ulcer with bleeding Please see assessment and plan  above   Subjective:  Patient denies any abdominal pain or recurrent episodes of melena or hematemesis since the EGD earlier today.  On further discussion patient denies significant alcohol use, denies NSAID use.  Physical Exam:  Vitals:   12/25/22 2100 12/25/22 2318 12/26/22 0000 12/26/22 0003  BP: 104/65 (!) 103/56  (!) 96/56  Pulse: 63 66  81  Resp: 14 13  (!) 22  Temp:   98.7 F (37.1 C)   TempSrc:   Oral   SpO2: 100% 98%  98%  Weight:      Height:        Constitutional: Awake alert and oriented x3, no associated distress.   Skin: no rashes, no lesions, good skin turgor noted. Eyes: Pupils are equally reactive to light.  No evidence of scleral icterus or conjunctival pallor.  ENMT: Moist mucous membranes noted.  Posterior pharynx clear of any exudate or lesions.   Respiratory: clear to auscultation bilaterally, no wheezing, no crackles. Normal respiratory effort. No accessory muscle use.  Cardiovascular: Regular rate and rhythm, no murmurs / rubs / gallops. No extremity edema. 2+ pedal pulses. No carotid bruits.  Abdomen: Abdomen is soft and nontender.  No evidence of intra-abdominal masses.  Positive bowel sounds noted in all quadrants.   Musculoskeletal: No joint deformity upper and lower extremities. Good ROM, no contractures. Normal muscle tone.    Data Reviewed:  I have personally reviewed and interpreted labs, imaging.  Significant findings are   CBC: Recent Labs  Lab 12/25/22 0354 12/25/22 0557 12/25/22 1140 12/25/22 1658   WBC 4.6  --   --   --    HGB 14.0 12.2* 11.8*  11.2*   HCT 41.9 37.0* 35.8* 33.8*   MCV 92.9  --   --   --    PLT 257  --   --   --     Basic Metabolic Panel: Recent Labs  Lab 12/25/22 0354   NA 138   K 4.0   CL 104   CO2 26   GLUCOSE 154*   BUN 35*   CREATININE 0.95   CALCIUM 8.2*   MG  --    PHOS  --     GFR: Estimated Creatinine Clearance: 91.4 mL/min (by C-G formula based on SCr of 0.96 mg/dL). Liver Function  Tests: Recent Labs  Lab 12/25/22 0354   AST 18   ALT 18   ALKPHOS 47   BILITOT 1.0   PROT 6.6   ALBUMIN 3.8      Telemetry: Personally reviewed.  Rhythm is normal sinus rhythm with heart rate of 70.  No dynamic ST segment changes appreciated.   Code Status:  Full code.  Code status decision has been confirmed with: patient    Severity of Illness:  The appropriate patient status for this patient is INPATIENT. Inpatient status is judged to be reasonable and necessary in order to provide the required intensity of service to ensure the patient's safety. The patient's presenting symptoms, physical exam findings, and initial radiographic and laboratory data in the context of their chronic comorbidities is felt to place them at high risk for further clinical deterioration. Furthermore, it is not anticipated that the patient will be medically stable for discharge from the hospital within 2 midnights of admission.   * I certify that at the point of admission it is my clinical judgment that the patient will require inpatient hospital care spanning beyond 2 midnights from the point of admission due to high intensity of service, high risk for further deterioration and high frequency of surveillance required.*  Time spent:  60 minutes  Author:  Marinda Elk MD

## 2022-12-27 LAB — SURGICAL PATHOLOGY

## 2022-12-30 ENCOUNTER — Encounter (HOSPITAL_COMMUNITY): Payer: Self-pay | Admitting: Internal Medicine

## 2023-03-21 ENCOUNTER — Ambulatory Visit: Payer: Self-pay | Admitting: Family Medicine

## 2024-06-05 ENCOUNTER — Emergency Department (HOSPITAL_COMMUNITY)
Admission: EM | Admit: 2024-06-05 | Discharge: 2024-06-05 | Disposition: A | Discharge status: Home / Self Care | Payer: Self-pay

## 2024-06-05 ENCOUNTER — Emergency Department (HOSPITAL_COMMUNITY): Payer: Self-pay

## 2024-06-05 DIAGNOSIS — J069 Acute upper respiratory infection, unspecified: Secondary | ICD-10-CM | POA: Insufficient documentation

## 2024-06-05 DIAGNOSIS — R42 Dizziness and giddiness: Secondary | ICD-10-CM | POA: Insufficient documentation

## 2024-06-05 LAB — CBC
HCT: 47.2 % (ref 39.0–52.0)
Hemoglobin: 16.3 g/dL (ref 13.0–17.0)
MCH: 30.9 pg (ref 26.0–34.0)
MCHC: 34.5 g/dL (ref 30.0–36.0)
MCV: 89.6 fL (ref 80.0–100.0)
Platelets: 290 K/uL (ref 150–400)
RBC: 5.27 MIL/uL (ref 4.22–5.81)
RDW: 12.7 % (ref 11.5–15.5)
WBC: 6.4 K/uL (ref 4.0–10.5)
nRBC: 0 % (ref 0.0–0.2)

## 2024-06-05 LAB — COMPREHENSIVE METABOLIC PANEL WITH GFR
ALT: 21 U/L (ref 0–44)
AST: 30 U/L (ref 15–41)
Albumin: 4.5 g/dL (ref 3.5–5.0)
Alkaline Phosphatase: 80 U/L (ref 38–126)
Anion gap: 10 (ref 5–15)
BUN: 19 mg/dL (ref 6–20)
CO2: 25 mmol/L (ref 22–32)
Calcium: 9.6 mg/dL (ref 8.9–10.3)
Chloride: 104 mmol/L (ref 98–111)
Creatinine, Ser: 1.14 mg/dL (ref 0.61–1.24)
GFR, Estimated: >60 mL/min (ref >60)
Glucose, Bld: 100 mg/dL — ABNORMAL HIGH (ref 70–99)
Potassium: 4.1 mmol/L (ref 3.5–5.1)
Sodium: 139 mmol/L (ref 135–145)
Total Bilirubin: 0.4 mg/dL (ref 0.0–1.2)
Total Protein: 7.6 g/dL (ref 6.5–8.1)

## 2024-06-05 LAB — URINALYSIS, ROUTINE W REFLEX MICROSCOPIC
Bilirubin Urine: NEGATIVE
Glucose, UA: NEGATIVE mg/dL
Hgb urine dipstick: NEGATIVE
Ketones, ur: NEGATIVE mg/dL
Leukocytes,Ua: NEGATIVE
Nitrite: NEGATIVE
Protein, ur: NEGATIVE mg/dL
Specific Gravity, Urine: 1.026 (ref 1.005–1.030)
pH: 5 (ref 5.0–8.0)

## 2024-06-05 LAB — RESP PANEL BY RT-PCR (RSV, FLU A&B, COVID)  RVPGX2
Influenza A by PCR: NEGATIVE
Influenza B by PCR: NEGATIVE
Resp Syncytial Virus by PCR: NEGATIVE
SARS Coronavirus 2 by RT PCR: NEGATIVE

## 2024-06-05 LAB — GROUP A STREP BY PCR: Group A Strep by PCR: NOT DETECTED

## 2024-06-05 LAB — CBG MONITORING, ED: Glucose-Capillary: 105 mg/dL — ABNORMAL HIGH (ref 70–99)

## 2024-06-05 MED ORDER — MECLIZINE HCL 25 MG PO TABS
25.0000 mg | ORAL_TABLET | Freq: Once | ORAL | Status: AC
  Administered 2024-06-05: 25 mg via ORAL
  Filled 2024-06-05: qty 1

## 2024-06-05 MED ORDER — MECLIZINE HCL 25 MG PO TABS: 25.0000 mg | ORAL_TABLET | Freq: Three times a day (TID) | ORAL | 0 refills | Status: AC | PRN

## 2024-06-05 NOTE — ED Notes (Signed)
   06/05/24 1848  Orthostatic Lying   BP- Lying 110/72  Pulse- Lying 80  Orthostatic Sitting  BP- Sitting 112/79  Pulse- Sitting 82  Orthostatic Standing at 0 minutes  BP- Standing at 0 minutes 120/83  Pulse- Standing at 0 minutes 88

## 2024-06-05 NOTE — ED Provider Notes (Signed)
 [place] EMERGENCY DEPARTMENT AT [facility] Provider Note   CSN: [id number] Arrival date & time: 06/05/24  1652     Patient presents with: Sore Throat and Dizziness   Gregory Leon is a 43 y.o. male past medical history significant for acute blood loss anemia presents today for lightheadedness, dizziness, near syncopal episode, and headache.  Patient also reporting sore throat and congestion.  Patient states that his symptoms began 2 weeks ago.  Patient denies trauma, nausea, vomiting, fever, chills, cough, hematemesis, chest pain, shortness of breath, or blood in stool.    Sore Throat  Dizziness      Prior to Admission medications   Medication Sig Start Date End Date Taking? Authorizing Provider  meclizine (ANTIVERT) 25 MG tablet Take 1 tablet (25 mg total) by mouth 3 (three) times daily as needed for dizziness. 06/05/24  Yes Avayah Raffety N, PA-C  pantoprazole  (PROTONIX ) 40 MG tablet Take 1 tablet (40 mg total) by mouth daily. 12/27/22 02/25/23  [place], Eric J, DO  sucralfate  (CARAFATE ) 1 g tablet Take 1 tablet (1 g total) by mouth 4 (four) times daily for 14 days. Dissolve in water 12/26/22 01/09/23  [place], Camellia PARAS, DO    Allergies: Patient has no known allergies.    Review of Systems  HENT:  Positive for congestion and sore throat.   Neurological:  Positive for dizziness and light-headedness.    Updated Vital Signs BP 127/77   Pulse 88   Temp 99.5 F (37.5 C) (Oral)   Resp 17   SpO2 100%   Physical Exam Vitals and nursing note reviewed.  Constitutional:      General: He is not in acute distress.    Appearance: He is well-developed. He is not toxic-appearing.  HENT:     Head: Normocephalic and atraumatic.     Mouth/Throat:     Mouth: Mucous membranes are moist.     Pharynx: Uvula midline. Posterior oropharyngeal erythema present. No pharyngeal swelling, oropharyngeal exudate or uvula swelling.     Tonsils: No tonsillar exudate or  tonsillar abscesses.  Eyes:     Extraocular Movements:     Right eye: Normal extraocular motion.     Left eye: Normal extraocular motion.     Conjunctiva/sclera: Conjunctivae normal.     Pupils: Pupils are equal, round, and reactive to light.  Cardiovascular:     Rate and Rhythm: Normal rate and regular rhythm.     Heart sounds: Normal heart sounds. No murmur heard. Pulmonary:     Effort: Pulmonary effort is normal. No respiratory distress.     Breath sounds: Normal breath sounds.  Abdominal:     Palpations: Abdomen is soft.     Tenderness: There is no abdominal tenderness.  Musculoskeletal:        General: No swelling.     Cervical back: Neck supple.  Skin:    General: Skin is warm and dry.     Capillary Refill: Capillary refill takes less than 2 seconds.  Neurological:     General: No focal deficit present.     Mental Status: He is alert and oriented to person, place, and time.  Psychiatric:        Mood and Affect: Mood normal.     (all labs ordered are listed, but only abnormal results are displayed) Labs Reviewed  COMPREHENSIVE METABOLIC PANEL WITH GFR - Abnormal; Notable for the following components:      Result Value   Glucose, Bld 100 (*)  All other components within normal limits  CBG MONITORING, ED - Abnormal; Notable for the following components:   Glucose-Capillary 105 (*)    All other components within normal limits  GROUP A STREP BY PCR  RESP PANEL BY RT-PCR (RSV, FLU A&B, COVID)  [id number]  CBC  URINALYSIS, ROUTINE W REFLEX MICROSCOPIC    EKG: EKG Interpretation Date/Time:  Friday June 05 2024 18:39:35 EST Ventricular Rate:  78 PR Interval:  167 QRS Duration:  93 QT Interval:  343 QTC Calculation: 391 R Axis:   87  Text Interpretation: Sinus rhythm ST elev, probable normal early repol pattern No previous for comparison Confirmed by Gennaro Bouchard ([id number]) on 06/06/2024 5:13:09 PM  Radiology: No results found.   Procedures   Medications  Ordered in the ED  meclizine (ANTIVERT) tablet 25 mg (25 mg Oral Given 06/05/24 2004)                                    Medical Decision Making Amount and/or Complexity of Data Reviewed Labs: ordered. Radiology: ordered.   This patient presents to the ED for concern of dizziness, lightheadedness, sore throat, congestion, this involves an extensive number of treatment options, and is a complaint that carries with it a high risk of complications and morbidity.  The differential diagnosis includes anemia, arrhythmia, electrolyte abnormality, COVID, flu, RSV, strep pharyngitis, dehydration, hypotension, hypoglycemia, hypertension, hyperglycemia, BPPV   Co morbidities / Chronic conditions that complicate the patient evaluation  Acute blood loss anemia   Additional history obtained:  Additional history obtained from EMR External records from outside source obtained and reviewed including previous admission documents   Lab Tests:  I Ordered, and personally interpreted labs.  The pertinent results include: CBC and CMP unremarkable, strep PCR negative, respiratory panel negative, UA unremarkable   Imaging Studies ordered:  I ordered imaging studies including CT Head w/o  I independently visualized and interpreted imaging which showed No acute intracranial abnormality I agree with the radiologist interpretation   Cardiac Monitoring: / EKG:  The patient was maintained on a cardiac monitor.  I personally viewed and interpreted the cardiac monitored which showed an underlying rhythm of: Sinus rhythm   Problem List / ED Course / Critical interventions / Medication management I ordered medication including antivert   Reevaluation of the patient after these medicines showed that the patient some improvement of symptoms I have reviewed the patients home medicines and have made adjustments as needed Orthostatic negative  Test / Admission - Considered:  Upon reassessment patient  states that his dizziness has resolved after Antivert. Considered for admission or further workup however patient's vital signs, physical exam, labs, and imaging are reassuring.  Patient given course of Antivert outpatient and encouraged to follow-up with his primary care if his symptoms persist for further evaluation workup.  I feel patient is safe for discharge at this time.     Final diagnoses:  Dizziness  Upper respiratory tract infection, unspecified type    ED Discharge Orders          Ordered    meclizine (ANTIVERT) 25 MG tablet  3 times daily PRN        06/05/24 2223               Francis Ileana SAILOR, PA-C 06/08/24 1358    Neysa Caron PARAS, DO 06/08/24 1606

## 2024-06-05 NOTE — Discharge Instructions (Addendum)
 Today you were seen for dizziness and an upper respiratory infection.  You have been prescribed Antivert for your dizziness.  You may take flonase and Mucinex for your upper respiratory symptoms.  Thank you for letting us  treat you today. After reviewing your labs and imaging, I feel you are safe to go home. Please follow up with your PCP in the next several days and provide them with your records from this visit. Return to the Emergency Room if pain becomes severe or symptoms worsen.

## 2024-06-05 NOTE — ED Notes (Signed)
 Pt ambulated independently in hall. Still reports some dizziness.

## 2024-06-05 NOTE — ED Triage Notes (Signed)
 C/o lightheaded, dizziness, near syncopal episodes, and headache. Symptoms started two weeks ago. Also having ST, congestion.
